# Patient Record
Sex: Female | Born: 1955 | Race: Black or African American | Hispanic: No | Marital: Married | Smoking: Never smoker
Health system: Southern US, Community
[De-identification: ages and names within clinical notes are randomized; demographics above are authoritative.]

## PROBLEM LIST (undated history)

## (undated) DIAGNOSIS — G2581 Restless legs syndrome: Secondary | ICD-10-CM

## (undated) DIAGNOSIS — K5790 Diverticulosis of intestine, part unspecified, without perforation or abscess without bleeding: Secondary | ICD-10-CM

## (undated) DIAGNOSIS — T7840XA Allergy, unspecified, initial encounter: Secondary | ICD-10-CM

## (undated) DIAGNOSIS — J329 Chronic sinusitis, unspecified: Secondary | ICD-10-CM

## (undated) HISTORY — DX: Allergy, unspecified, initial encounter: T78.40XA

## (undated) HISTORY — PX: COLONOSCOPY: SHX174

## (undated) HISTORY — DX: Diverticulosis of intestine, part unspecified, without perforation or abscess without bleeding: K57.90

## (undated) HISTORY — DX: Chronic sinusitis, unspecified: J32.9

## (undated) HISTORY — PX: NASAL POLYP SURGERY: SHX186

## (undated) HISTORY — DX: Restless legs syndrome: G25.81

---

## 2000-05-28 ENCOUNTER — Encounter: Payer: Self-pay | Admitting: Family Medicine

## 2000-05-28 ENCOUNTER — Encounter: Admission: RE | Admit: 2000-05-28 | Discharge: 2000-05-28 | Payer: Self-pay | Admitting: Family Medicine

## 2005-09-27 ENCOUNTER — Other Ambulatory Visit: Admission: RE | Admit: 2005-09-27 | Discharge: 2005-09-27 | Payer: Self-pay | Admitting: Family Medicine

## 2005-11-29 ENCOUNTER — Encounter: Admission: RE | Admit: 2005-11-29 | Discharge: 2005-11-29 | Payer: Self-pay | Admitting: Family Medicine

## 2006-02-20 ENCOUNTER — Encounter: Admission: RE | Admit: 2006-02-20 | Discharge: 2006-02-20 | Payer: Self-pay | Admitting: Family Medicine

## 2006-07-30 ENCOUNTER — Ambulatory Visit: Payer: Self-pay | Admitting: Family Medicine

## 2006-09-30 ENCOUNTER — Ambulatory Visit: Payer: Self-pay | Admitting: Family Medicine

## 2007-01-14 ENCOUNTER — Ambulatory Visit: Payer: Self-pay | Admitting: Family Medicine

## 2007-02-12 ENCOUNTER — Ambulatory Visit: Payer: Self-pay | Admitting: Family Medicine

## 2007-08-04 ENCOUNTER — Ambulatory Visit: Payer: Self-pay | Admitting: Family Medicine

## 2007-09-12 ENCOUNTER — Ambulatory Visit: Payer: Self-pay | Admitting: Family Medicine

## 2007-09-30 ENCOUNTER — Ambulatory Visit: Payer: Self-pay | Admitting: Family Medicine

## 2007-09-30 ENCOUNTER — Encounter: Admission: RE | Admit: 2007-09-30 | Discharge: 2007-09-30 | Payer: Self-pay | Admitting: Family Medicine

## 2007-11-17 ENCOUNTER — Ambulatory Visit: Payer: Self-pay | Admitting: Family Medicine

## 2007-12-19 ENCOUNTER — Ambulatory Visit: Payer: Self-pay | Admitting: Family Medicine

## 2007-12-31 ENCOUNTER — Ambulatory Visit: Payer: Self-pay | Admitting: Family Medicine

## 2008-06-25 ENCOUNTER — Encounter: Admission: RE | Admit: 2008-06-25 | Discharge: 2008-06-25 | Payer: Self-pay | Admitting: Family Medicine

## 2009-02-07 ENCOUNTER — Ambulatory Visit: Payer: Self-pay | Admitting: Family Medicine

## 2009-02-07 ENCOUNTER — Encounter: Admission: RE | Admit: 2009-02-07 | Discharge: 2009-02-07 | Payer: Self-pay | Admitting: Family Medicine

## 2009-05-17 ENCOUNTER — Ambulatory Visit: Payer: Self-pay | Admitting: Family Medicine

## 2009-07-14 ENCOUNTER — Encounter: Admission: RE | Admit: 2009-07-14 | Discharge: 2009-07-14 | Payer: Self-pay | Admitting: Family Medicine

## 2009-07-14 LAB — HM MAMMOGRAPHY

## 2009-07-27 ENCOUNTER — Ambulatory Visit: Payer: Self-pay | Admitting: Family Medicine

## 2009-09-15 ENCOUNTER — Ambulatory Visit: Payer: Self-pay | Admitting: Family Medicine

## 2009-10-19 ENCOUNTER — Encounter: Admission: RE | Admit: 2009-10-19 | Discharge: 2009-10-19 | Payer: Self-pay | Admitting: Family Medicine

## 2009-10-19 ENCOUNTER — Ambulatory Visit: Payer: Self-pay | Admitting: Family Medicine

## 2009-12-21 ENCOUNTER — Ambulatory Visit: Payer: Self-pay | Admitting: Family Medicine

## 2010-01-05 ENCOUNTER — Encounter: Admission: RE | Admit: 2010-01-05 | Discharge: 2010-01-05 | Payer: Self-pay | Admitting: Otolaryngology

## 2010-03-30 ENCOUNTER — Ambulatory Visit: Payer: Self-pay | Admitting: Physician Assistant

## 2010-06-05 ENCOUNTER — Ambulatory Visit: Payer: Self-pay | Admitting: Family Medicine

## 2010-09-04 ENCOUNTER — Ambulatory Visit: Payer: Self-pay | Admitting: Family Medicine

## 2010-11-24 ENCOUNTER — Ambulatory Visit
Admission: RE | Admit: 2010-11-24 | Discharge: 2010-11-24 | Payer: Self-pay | Source: Home / Self Care | Attending: Family Medicine | Admitting: Family Medicine

## 2010-11-29 ENCOUNTER — Ambulatory Visit
Admission: RE | Admit: 2010-11-29 | Discharge: 2010-11-29 | Payer: Self-pay | Source: Home / Self Care | Attending: Family Medicine | Admitting: Family Medicine

## 2010-12-10 ENCOUNTER — Encounter: Payer: Self-pay | Admitting: Family Medicine

## 2011-01-24 ENCOUNTER — Ambulatory Visit (INDEPENDENT_AMBULATORY_CARE_PROVIDER_SITE_OTHER): Payer: BC Managed Care – PPO | Admitting: Family Medicine

## 2011-01-24 DIAGNOSIS — J45901 Unspecified asthma with (acute) exacerbation: Secondary | ICD-10-CM

## 2011-01-24 DIAGNOSIS — J0100 Acute maxillary sinusitis, unspecified: Secondary | ICD-10-CM

## 2011-04-19 ENCOUNTER — Other Ambulatory Visit: Payer: Self-pay | Admitting: Family Medicine

## 2011-04-19 MED ORDER — MELOXICAM 15 MG PO TABS: 15.0000 mg | ORAL_TABLET | Freq: Every day | ORAL | Status: DC

## 2011-05-05 ENCOUNTER — Other Ambulatory Visit: Payer: Self-pay | Admitting: Family Medicine

## 2011-05-10 ENCOUNTER — Encounter: Payer: Self-pay | Admitting: Family Medicine

## 2011-05-10 ENCOUNTER — Ambulatory Visit (INDEPENDENT_AMBULATORY_CARE_PROVIDER_SITE_OTHER): Payer: BC Managed Care – PPO | Admitting: Family Medicine

## 2011-05-10 VITALS — BP 110/76 | HR 76 | Temp 98.2°F | Ht 63.0 in | Wt 164.0 lb

## 2011-05-10 DIAGNOSIS — J209 Acute bronchitis, unspecified: Secondary | ICD-10-CM

## 2011-05-10 DIAGNOSIS — J45901 Unspecified asthma with (acute) exacerbation: Secondary | ICD-10-CM | POA: Insufficient documentation

## 2011-05-10 MED ORDER — BUDESONIDE-FORMOTEROL FUMARATE 160-4.5 MCG/ACT IN AERO: 2.0000 | INHALATION_SPRAY | Freq: Two times a day (BID) | RESPIRATORY_TRACT | Status: DC

## 2011-05-10 MED ORDER — ALBUTEROL SULFATE HFA 108 (90 BASE) MCG/ACT IN AERS: 2.0000 | INHALATION_SPRAY | Freq: Four times a day (QID) | RESPIRATORY_TRACT | Status: DC | PRN

## 2011-05-10 MED ORDER — AZITHROMYCIN 250 MG PO TABS: ORAL_TABLET | ORAL | Status: AC

## 2011-05-10 MED ORDER — PREDNISONE 20 MG PO TABS: 20.0000 mg | ORAL_TABLET | Freq: Two times a day (BID) | ORAL | Status: AC

## 2011-05-10 NOTE — Patient Instructions (Signed)
Increase Symbicort to 2 puffs twice daily.  Once your asthma is completely stable, you can consider decreasing back to prior dose, but always to increase back up to 2 puffs twice daily with any exacerbation.  Make sure you use your albuterol as needed, using your peak flows to help you know when you need it.

## 2011-05-10 NOTE — Progress Notes (Signed)
  Subjective:    Patient ID: Kayla Henry, female    DOB: 18-May-1956, 55 y.o.   MRN: [id number]  HPI Patient presents with complaint of 2 days of wheezing and tightness in her chest.  She is coughing up white, yellow and brown phlegm. Having some head congestion, but denies sinus pain.  Doing sinus rinses,and only getting small amount of yellow mucus.  Denies fevers.  Denies sick contacts.  Using Symbicort 1P twice daily, and Flonase 1 spray twice daily.  Needing to use ProAir HFA over the last few days, no more than 2-3 times/day.  Gets a good response to the inhaler.  Baseline PF's are in the 500's.  Peak Flows have been 350's the last couple of days  Current Outpatient Prescriptions on File Prior to Visit  Medication Sig Dispense Refill  . budesonide-formoterol (SYMBICORT) 160-4.5 MCG/ACT inhaler Inhale 2 puffs into the lungs 2 (two) times daily.  1 Inhaler  5  . fluticasone (FLONASE) 50 MCG/ACT nasal spray USE TWO SPRAY IN EACH NOSTRIL TWICE DAILY;MUST BE SEEN  16 g  0  . meloxicam (MOBIC) 15 MG tablet Take 1 tablet (15 mg total) by mouth daily.  30 tablet  2   Allergies  Allergen Reactions  . Sulfa Antibiotics Hives   Past Medical History  Diagnosis Date  . Asthma   . Allergy   . Diverticulosis    Immunization History  Administered Date(s) Administered  . Pneumococcal Polysaccharide 09/27/2005   Review of Systems Denies fevers, nausea, vomiting, diarrhea, skin rash, or other complaints    Objective:   Physical Exam BP 110/76  Pulse 76  Temp(Src) 98.2 F (36.8 C) (Oral)  Ht 5\' 3"  (1.6 m)  Wt 164 lb (74.39 kg)  BMI 29.05 kg/m2 Well developed female, in no distress.  Speaking easily in full sentences, only occasional cough Audible upper airway wheezing heard from across the room, improved after coughing Focal area of ronchi R base.  After coughing, increased, and some wheezes were noted throughout, but slightly more prominent in R base.  Fair air movement  throughout. HEENT:  PERRL, EOMI, conjunctiva clear.  OP clear, Mucus membranes moist, sinuses nontender, nose, mild edema, no purulence or erythema Neck: no lymphadenopathy Heart:  Regular rate and rhythm  Peak flow 250 x 2     Assessment & Plan:   1. Bronchitis, acute  azithromycin (ZITHROMAX Z-PAK) 250 MG tablet  2. Asthma exacerbation  albuterol (PROAIR HFA) 108 (90 BASE) MCG/ACT inhaler, budesonide-formoterol (SYMBICORT) 160-4.5 MCG/ACT inhaler, predniSONE (DELTASONE) 20 MG tablet    Patient declines nebulizer treatment today.  States she can give herself one at home, and she needs to leave. Instructed to increase Symbicort to 2P BID, and to use albuterol more frequently if needed, (especially as she notes her PF's to be low).  Continue monitoring PF. Prednisone 20mg  BID x 3 days--risks and side effects were reviewed

## 2011-09-11 ENCOUNTER — Encounter: Payer: Self-pay | Admitting: Family Medicine

## 2011-09-11 ENCOUNTER — Ambulatory Visit (INDEPENDENT_AMBULATORY_CARE_PROVIDER_SITE_OTHER): Payer: BC Managed Care – PPO | Admitting: Family Medicine

## 2011-09-11 ENCOUNTER — Other Ambulatory Visit: Payer: Self-pay | Admitting: Family Medicine

## 2011-09-11 DIAGNOSIS — G2581 Restless legs syndrome: Secondary | ICD-10-CM

## 2011-09-11 DIAGNOSIS — J45901 Unspecified asthma with (acute) exacerbation: Secondary | ICD-10-CM

## 2011-09-11 DIAGNOSIS — J45909 Unspecified asthma, uncomplicated: Secondary | ICD-10-CM

## 2011-09-11 DIAGNOSIS — J019 Acute sinusitis, unspecified: Secondary | ICD-10-CM

## 2011-09-11 MED ORDER — LEVOFLOXACIN 500 MG PO TABS: 500.0000 mg | ORAL_TABLET | Freq: Every day | ORAL | Status: AC

## 2011-09-11 MED ORDER — ROPINIROLE HCL 0.25 MG PO TABS: 0.2500 mg | ORAL_TABLET | Freq: Three times a day (TID) | ORAL | Status: DC

## 2011-09-11 NOTE — Patient Instructions (Addendum)
Usually your inhaler 2 puffs twice a day. Call me at the end of the course of the antibiotic and let he know how you are doing. Keep using the albuterol. Come back in about a month we'll check your pressure again

## 2011-09-11 NOTE — Progress Notes (Signed)
  Subjective:    Patient ID: Kayla Henry, female    DOB: 18-Sep-1956, 55 y.o.   MRN: [id number]  HPI She has a two-week history of sore throat, sinus pressure, PND, malaise and now productive cough. She has a long history of difficulty with this every several months. This has been going on for the last several years. She has apparently had a workup including CT of sinuses and ENT evaluation in the past for this which was essentially negative. She continues on her Symbicort but has only been taking it daily until recently when she increases to twice a day. She continues with nasal irrigation and use of Flonase. She has used her Provera twice in the last week. At the end of the interview after we discussed everything she then mentioned difficulty with sleep and restless legs. She has no difficulty with these during the day when she lays down at night it interferes with her ability to follow sleep.   Review of Systems     Objective:   Physical Exam alert and in no distress. Tympanic membranes and canals are normal. Throat is clear. Tonsils are normal. Neck is supple without adenopathy or thyromegaly. Cardiac exam shows a regular sinus rhythm without murmurs or gallops. Lungs showed bilateral rhonchi and expiratory wheezing. Nasal mucosa is slightly reddish. No tenderness over sinuses.        Assessment & Plan:   1. Asthma exacerbation   2. Sinusitis, acute   3. Asthmatic bronchitis   4. RLS (restless legs syndrome)    she is to continue to take her Symbicort 2 puffs twice per day. I will place her on Levaquin and she is to call me at the end of the course of this. She will continue on her nasal inhaler. I will also give her Requip and let me know how this medication works.

## 2011-09-11 NOTE — Telephone Encounter (Signed)
Patient would like a refill on Mobic. CLS

## 2011-09-12 ENCOUNTER — Telehealth: Payer: Self-pay | Admitting: *Deleted

## 2011-09-12 NOTE — Telephone Encounter (Signed)
Refill request came in for patient's Mobic, refused by Dr.Knapp as she did not know what patient took this medication for. Patient needs to schedule appt in the near future for either med check or CPE and Dr.Knapp will discuss at that visit the need for Mobic. Left message on patient's voicemail to return my call.

## 2011-10-01 ENCOUNTER — Telehealth: Payer: Self-pay | Admitting: Family Medicine

## 2011-10-01 MED ORDER — LEVOFLOXACIN 500 MG PO TABS: 500.0000 mg | ORAL_TABLET | Freq: Every day | ORAL | Status: AC

## 2011-10-01 MED ORDER — MELOXICAM 15 MG PO TABS: 15.0000 mg | ORAL_TABLET | Freq: Every day | ORAL | Status: DC

## 2011-10-01 MED ORDER — FLUTICASONE PROPIONATE 50 MCG/ACT NA SUSP: 2.0000 | Freq: Every day | NASAL | Status: DC

## 2011-10-01 NOTE — Telephone Encounter (Signed)
I called her medication in. Call her and let her know

## 2011-10-01 NOTE — Telephone Encounter (Signed)
Pt states she also asked about her Mobic refill when she called this morning. She states she takes for arthritis and original rx was given by our office  [facility] on [address]

## 2011-10-01 NOTE — Telephone Encounter (Signed)
Pt notified it was called in

## 2011-10-01 NOTE — Telephone Encounter (Signed)
E-rxd

## 2011-10-01 NOTE — Telephone Encounter (Signed)
Flonase and Levaquin was called in.

## 2011-10-18 ENCOUNTER — Encounter: Payer: Self-pay | Admitting: Family Medicine

## 2011-12-03 ENCOUNTER — Ambulatory Visit (INDEPENDENT_AMBULATORY_CARE_PROVIDER_SITE_OTHER): Payer: BC Managed Care – PPO | Admitting: Family Medicine

## 2011-12-03 ENCOUNTER — Encounter: Payer: Self-pay | Admitting: Family Medicine

## 2011-12-03 VITALS — BP 130/74 | HR 76 | Temp 98.2°F | Ht 63.0 in | Wt 163.0 lb

## 2011-12-03 DIAGNOSIS — J069 Acute upper respiratory infection, unspecified: Secondary | ICD-10-CM

## 2011-12-03 DIAGNOSIS — Z23 Encounter for immunization: Secondary | ICD-10-CM

## 2011-12-03 MED ORDER — CLARITHROMYCIN ER 500 MG PO TB24: 1000.0000 mg | ORAL_TABLET | Freq: Every day | ORAL | Status: AC

## 2011-12-03 NOTE — Progress Notes (Signed)
Chief complaint: cough and congestion with yellow mucus x 3-4 days.  HPI: Patient began with cough and nasal congestion 3-4 days ago, then started coughing up phlegm.  Phlegm is yellowish.  Not getting much nasal discharge, but occasional yellow. + sinus pain, pressure, headaches. Denies sore throat, fever.  Took an OTC decongestant, which helped her sleep.  Also taking alka selzer decongestant.  Not using any meds during day.  Cough doesn't keep her awake at night.  Denies shortness of breath.  Doing sinus rinses, not getting much drainage out.  Past Medical History  Diagnosis Date  . Asthma   . Allergy   . Diverticulosis     Past Surgical History  Procedure Date  . Nasal polyp surgery     History   Social History  . Marital Status: Married    Spouse Name: N/A    Number of Children: N/A  . Years of Education: N/A   Occupational History  . Principal at [employer]    Social History Main Topics  . Smoking status: Never Smoker   . Smokeless tobacco: Never Used  . Alcohol Use: Yes     1 glass of wine twice a year.  . Drug Use: No  . Sexually Active: Not on file   Other Topics Concern  . Not on file   Social History Narrative  . No narrative on file   Current Outpatient Prescriptions on File Prior to Visit  Medication Sig Dispense Refill  . albuterol (PROAIR HFA) 108 (90 BASE) MCG/ACT inhaler Inhale 2 puffs into the lungs every 6 (six) hours as needed.  18 g  2  . budesonide-formoterol (SYMBICORT) 160-4.5 MCG/ACT inhaler Inhale 2 puffs into the lungs 2 (two) times daily.  1 Inhaler  5  . fluticasone (FLONASE) 50 MCG/ACT nasal spray Place 2 sprays into the nose daily.  16 g  11  . meloxicam (MOBIC) 15 MG tablet Take 1 tablet (15 mg total) by mouth daily.  30 tablet  2  . Multiple Vitamins-Minerals (MULTIVITAMIN WITH MINERALS) tablet Take 1 tablet by mouth daily.        Marland Kitchen rOPINIRole (REQUIP) 0.25 MG tablet Take 1 tablet (0.25 mg total) by mouth 3 (three) times daily.   30 tablet  2    Allergies  Allergen Reactions  . Sulfa Antibiotics Hives   ROS: denies fever, nausea, vomiting, diarrhea, skin rash, chest pain  PHYSICAL EXAM: BP 130/74  Pulse 76  Temp(Src) 98.2 F (36.8 C) (Oral)  Ht 5\' 3"  (1.6 m)  Wt 163 lb (73.936 kg)  BMI 28.87 kg/m2 Pleasant female, mildly congested-sounding, not coughing, in no distress HEENT:  PERRL, EOMI, conjunctiva clear. TM's and EAC's normal.  Nasal mucosa mildly edematous, slightly red, no purulence.  Tender over maxillary sinuses bilaterally.  OP clear without erythema Neck: no lymphadenopathy Heart: regular rate and rhythm without murmur Lungs: clear bilaterally posteriorly. Anterior on R there were some ronchi and trace wheeze which improved after coughing (expectorated small amount of phlegm).  Good air movement throughout Skin: no rash  ASSESSMENT/PLAN: 1. URI (upper respiratory infection)  clarithromycin (BIAXIN XL) 500 MG 24 hr tablet  2. Need for prophylactic vaccination and inoculation against influenza  Flu vaccine greater than or equal to 3yo preservative free IM   URI, likely viral, in a patient with allergies and asthma with frequent bacterial complication of URI's.  At this point has no evidence of bacterial infection.  I recommend using expectorant decongestant regularly, continue sinus rinses,  and if no improvement in 1-2 days, start ABX.

## 2011-12-03 NOTE — Patient Instructions (Addendum)
I recommend using expectorant (such as Mucinex), as well as decongestant (ie Mucinex-D or sudafed) to help with sinus pain and pressure. If symptoms persist or worsen over the next few days, start the Biaxin.  You may need to use your albuterol if having increasing chest congestion or shortness of breath.  Follow up if high fevers, worsening shortness of breath, or ongoing symptoms, despite use of antibiotics and albuterol (in addition to your chronic medications).  Try and get flu shots in the fall (October)

## 2011-12-28 ENCOUNTER — Ambulatory Visit (INDEPENDENT_AMBULATORY_CARE_PROVIDER_SITE_OTHER): Payer: BC Managed Care – PPO | Admitting: Family Medicine

## 2011-12-28 ENCOUNTER — Encounter: Payer: Self-pay | Admitting: Family Medicine

## 2011-12-28 ENCOUNTER — Telehealth: Payer: Self-pay | Admitting: Internal Medicine

## 2011-12-28 VITALS — BP 150/90 | HR 90 | Ht 63.0 in | Wt 157.0 lb

## 2011-12-28 DIAGNOSIS — J45909 Unspecified asthma, uncomplicated: Secondary | ICD-10-CM

## 2011-12-28 MED ORDER — METHYLPREDNISOLONE SODIUM SUCC 125 MG IJ SOLR: 125.0000 mg | Freq: Once | INTRAMUSCULAR | Status: DC

## 2011-12-28 MED ORDER — METHYLPREDNISOLONE SODIUM SUCC 125 MG IJ SOLR
125.0000 mg | Freq: Once | INTRAMUSCULAR | Status: AC
  Administered 2011-12-28: 125 mg via INTRAMUSCULAR

## 2011-12-28 MED ORDER — PREDNISONE 20 MG PO TABS: 20.0000 mg | ORAL_TABLET | Freq: Every day | ORAL | Status: DC

## 2011-12-28 MED ORDER — PREDNISONE 20 MG PO TABS: ORAL_TABLET | ORAL | Status: DC

## 2011-12-28 MED ORDER — CLARITHROMYCIN 500 MG PO TABS: 500.0000 mg | ORAL_TABLET | Freq: Two times a day (BID) | ORAL | Status: AC

## 2011-12-28 NOTE — Patient Instructions (Signed)
Start back on your Symbicort and continue to use albuterol as a rescue. Start taking the Biaxin and the prednisone.

## 2011-12-28 NOTE — Progress Notes (Signed)
  Subjective:    Patient ID: Ernesta Amble, female    DOB: Aug 31, 1956, 56 y.o.   MRN: [id number]  HPI Proximally 3 weeks ago she developed symptoms of sinus congestion, rhinorrhea, headache. She has an underlying history of asthma and is on Symbicort as well as a rescue inhaler. She was seen January 14. She was given an antibiotic to be taken if her symptoms continued however she did not take it. Her sinus symptoms improved. She continued on her asthma medication. Approximately 5 days ago she noted the onset of increasing shortness of breath, wheezing, coughing. No fever or chills.   Review of Systems     Objective:   Physical Exam alert and in no distress. Tympanic membranes and canals are normal. Throat is clear. Tonsils are normal. Neck is supple without adenopathy or thyromegaly. Cardiac exam shows a regular sinus rhythm without murmurs or gallops. Lungs initially showed inspiratory and expiratory wheezing. She did respond to 3 nebulizer treatments. Peak flow initially was 170 and increased to 220. She did have 4 nebulizer treatments.        Assessment & Plan:   1. Asthmatic bronchitis    she is to take the Biaxin as well as prednisone. She'll also start back on Symbicort and using a as rescue. Recheck here in 10 days

## 2011-12-28 NOTE — Telephone Encounter (Signed)
Pt is suppose to take 2 pills in the morning per Dr. Susann Givens.

## 2012-02-06 ENCOUNTER — Other Ambulatory Visit: Payer: Self-pay | Admitting: Family Medicine

## 2012-02-06 NOTE — Telephone Encounter (Signed)
Mobic renewed.

## 2012-02-06 NOTE — Telephone Encounter (Signed)
Is this ok?

## 2012-02-06 NOTE — Telephone Encounter (Signed)
Is this ok to refill?  

## 2012-03-28 ENCOUNTER — Other Ambulatory Visit: Payer: Self-pay

## 2012-03-28 ENCOUNTER — Ambulatory Visit (INDEPENDENT_AMBULATORY_CARE_PROVIDER_SITE_OTHER): Payer: BC Managed Care – PPO | Admitting: Family Medicine

## 2012-03-28 ENCOUNTER — Ambulatory Visit
Admission: RE | Admit: 2012-03-28 | Discharge: 2012-03-28 | Disposition: A | Discharge status: Home / Self Care | Payer: BC Managed Care – PPO | Source: Ambulatory Visit | Attending: Family Medicine | Admitting: Family Medicine

## 2012-03-28 ENCOUNTER — Encounter: Payer: Self-pay | Admitting: Family Medicine

## 2012-03-28 VITALS — BP 130/90 | HR 75 | Resp 18 | Wt 152.0 lb

## 2012-03-28 DIAGNOSIS — J45909 Unspecified asthma, uncomplicated: Secondary | ICD-10-CM

## 2012-03-28 DIAGNOSIS — R062 Wheezing: Secondary | ICD-10-CM

## 2012-03-28 MED ORDER — METHYLPREDNISOLONE SODIUM SUCC 125 MG IJ SOLR
125.0000 mg | Freq: Once | INTRAMUSCULAR | Status: AC
  Administered 2012-03-28: 125 mg via INTRAMUSCULAR

## 2012-03-28 MED ORDER — PREDNISONE 10 MG PO KIT: PACK | ORAL | Status: DC

## 2012-03-28 MED ORDER — CLARITHROMYCIN 500 MG PO TABS: 500.0000 mg | ORAL_TABLET | Freq: Two times a day (BID) | ORAL | Status: AC

## 2012-03-28 NOTE — Progress Notes (Signed)
  Subjective:    Patient ID: Kayla Henry, female    DOB: 05/07/1956, 56 y.o.   MRN: [id number]  HPI 2 weeks ago she started having difficulty with sinus congestion followed by cough and chest tightness. No sore throat, fever, chills, earache. She does not smoke. She had a similar episode in February requiring needing treatments, steroids and an antibiotic   Review of Systems     Objective:   Physical Exam alert and in no distress. Tympanic membranes and canals are normal. Throat is clear. Tonsils are normal. Neck is supple without adenopathy or thyromegaly. Cardiac exam shows a regular sinus rhythm without murmurs or gallops. Lungs show wheezing. She did improve with several nebulizer treatments.        Assessment & Plan:   1. Wheezing  PR INHAL RX, AIRWAY OBST/DX SPUTUM INDUCT, PR ALBUTEROL IPRATROP NON-COMP, PR INHAL RX, AIRWAY OBST/DX SPUTUM INDUCT, PR ALBUTEROL IPRATROP NON-COMP, DG Chest 2 View  2. Asthmatic bronchitis  methylPREDNISolone sodium succinate (SOLU-MEDROL) 125 mg/2 mL injection 125 mg, PredniSONE (STERAPRED DS 12 DAY) 10 MG KIT, clarithromycin (BIAXIN) 500 MG tablet, DG Chest 2 View   Chest x-ray was also ordered which was negative for acute problems. If she continues to have difficulty, further evaluation will be necessary. I discussed the continued intermittent use of steroids and she is in agreement that she does not want to continue to do this.

## 2012-03-28 NOTE — Patient Instructions (Signed)
Call me if any questions. If this recurs, we will need to pursue this further

## 2012-03-28 NOTE — Telephone Encounter (Signed)
Resent med for 48 pills per [employer]

## 2012-03-31 NOTE — Progress Notes (Signed)
Addended by: Lavell Islam on: 03/31/2012 12:36 PM   Modules accepted: Orders

## 2012-05-09 ENCOUNTER — Other Ambulatory Visit: Payer: Self-pay | Admitting: Family Medicine

## 2012-05-12 NOTE — Telephone Encounter (Signed)
Is this ok?

## 2012-07-10 ENCOUNTER — Other Ambulatory Visit: Payer: Self-pay | Admitting: Family Medicine

## 2012-08-05 ENCOUNTER — Encounter: Payer: Self-pay | Admitting: Family Medicine

## 2012-08-05 ENCOUNTER — Ambulatory Visit (INDEPENDENT_AMBULATORY_CARE_PROVIDER_SITE_OTHER): Payer: BC Managed Care – PPO | Admitting: Family Medicine

## 2012-08-05 VITALS — BP 120/80 | HR 71 | Wt 154.0 lb

## 2012-08-05 DIAGNOSIS — J45901 Unspecified asthma with (acute) exacerbation: Secondary | ICD-10-CM

## 2012-08-05 DIAGNOSIS — J45909 Unspecified asthma, uncomplicated: Secondary | ICD-10-CM

## 2012-08-05 MED ORDER — MOXIFLOXACIN HCL 400 MG PO TABS: 400.0000 mg | ORAL_TABLET | Freq: Every day | ORAL | Status: DC

## 2012-08-05 MED ORDER — PREDNISONE 10 MG PO KIT: PACK | ORAL | Status: DC

## 2012-08-05 NOTE — Progress Notes (Signed)
  Subjective:    Patient ID: Kayla Henry, female    DOB: 04-Dec-1955, 56 y.o.   MRN: [id number]  HPI She started 2 weeks ago with a slight cough followed by ENT, worsening cough. No sore throat, fever, earache. She is getting more and more short of breath. She continues on her asthma medications as well as Flonase . She has been using her rescue inhaler quite frequently per She does get spring and fall allergies but they have not given her much trouble recently. She does not smoke.   Review of Systems     Objective:   Physical Exam alert and in no distress. Tympanic membranes and canals are normal. Throat is clear. Tonsils are normal. Neck is supple without adenopathy or thyromegaly. Cardiac exam shows a regular sinus rhythm without murmurs or gallops. Lungs expiratory wheezing        Assessment & Plan:   1. Acute asthmatic bronchitis  moxifloxacin (AVELOX) 400 MG tablet, PredniSONE (STERAPRED DS 12 DAY) 10 MG KIT   discuss therapies with her. She indicated that Avelox seems to be the best medication for her. We also discussed various options and decided to use Sterapred dose pack. She will call if further difficulties.

## 2012-09-25 ENCOUNTER — Encounter: Payer: Self-pay | Admitting: Family Medicine

## 2012-09-25 ENCOUNTER — Ambulatory Visit (INDEPENDENT_AMBULATORY_CARE_PROVIDER_SITE_OTHER): Payer: BC Managed Care – PPO | Admitting: Family Medicine

## 2012-09-25 ENCOUNTER — Other Ambulatory Visit: Payer: Self-pay

## 2012-09-25 VITALS — BP 136/84 | Wt 161.0 lb

## 2012-09-25 DIAGNOSIS — B029 Zoster without complications: Secondary | ICD-10-CM

## 2012-09-25 MED ORDER — HYDROCODONE-ACETAMINOPHEN 5-500 MG PO TABS: 1.0000 | ORAL_TABLET | ORAL | Status: DC | PRN

## 2012-09-25 MED ORDER — VALACYCLOVIR HCL 500 MG PO TABS: 1000.0000 mg | ORAL_TABLET | Freq: Three times a day (TID) | ORAL | Status: DC

## 2012-09-25 NOTE — Telephone Encounter (Signed)
called in vicodine

## 2012-09-25 NOTE — Progress Notes (Signed)
  Subjective:    Patient ID: Kayla Henry, female    DOB: [birth year], 56 y.o.   MRN: [id number]  HPI She is here for evaluation of pain and a rash as well as left breast pain for 2 days.   Review of Systems     Objective:   Physical Exam Classic vesicles with an erythematous base are noted on the back on the left and on the left breast.       Assessment & Plan:   1. Shingles  valACYclovir (VALTREX) 500 MG tablet, HYDROcodone-acetaminophen (VICODIN) 5-500 MG per tablet   information concerning shingles was given. She is to call if she has further difficulty.

## 2012-09-25 NOTE — Patient Instructions (Addendum)
Shingles Shingles is caused by the same virus that causes chickenpox (varicella zoster virus or VZV). Shingles often occurs many years or decades after having chickenpox. That is why it is more common in adults older than 50 years. The virus reactivates and breaks out as an infection in a nerve root. SYMPTOMS   The initial feeling (sensations) may be pain. This pain is usually described as:  Burning.  Stabbing.  Throbbing.  Tingling in the nerve root.  A red rash will follow in a couple days. The rash may occur in any area of the body and is usually on one side (unilateral) of the body in a band or belt-like pattern. The rash usually starts out as very small blisters (vesicles). They will dry up after 7 to 10 days. This is not usually a significant problem except for the pain it causes.  Long-lasting (chronic) pain is more likely in an elderly person. It can last months to years. This condition is called postherpetic neuralgia. Shingles can be an extremely severe infection in someone with AIDS, a weakened immune system, or with forms of leukemia. It can also be severe if you are taking transplant medicines or other medicines that weaken the immune system. TREATMENT  Your caregiver will often treat you with:  Antiviral drugs.  Anti-inflammatory drugs.  Pain medicines. Bed rest is very important in preventing the pain associated with herpes zoster (postherpetic neuralgia). Application of heat in the form of a hot water bottle or electric heating pad or gentle pressure with the hand is recommended to help with the pain or discomfort. PREVENTION  A varicella zoster vaccine is available to help protect against the virus. The Food and Drug Administration approved the varicella zoster vaccine for individuals 105 years of age and older. HOME CARE INSTRUCTIONS   Cool compresses to the area of rash may be helpful.  Only take over-the-counter or prescription medicines for pain, discomfort, or  fever as directed by your caregiver.  Avoid contact with:  Babies.  Pregnant women.  Children with eczema.  Elderly people with transplants.  People with chronic illnesses, such as leukemia and AIDS.  If the area involved is on your face, you may receive a referral for follow-up to a specialist. It is very important to keep all follow-up appointments. This will help avoid eye complications, chronic pain, or disability. SEEK IMMEDIATE MEDICAL CARE IF:   You develop any pain (headache) in the area of the face or eye. This must be followed carefully by your caregiver or ophthalmologist. An infection in part of your eye (cornea) can be very serious. It could lead to blindness.  You do not have pain relief from prescribed medicines.  Your redness or swelling spreads.  The area involved becomes very swollen and painful.  You have a fever.  You notice any red or painful lines extending away from the affected area toward your heart (lymphangitis).  Your condition is worsening or has changed. Document Released: 11/05/2005 Document Revised: 01/28/2012 Document Reviewed: 10/10/2009 Briannon Boggio C[facility] Patient Information 2013 [place], [place]. I will give you Vicodin for the pain and you can also take Aleve 2 tablets 2 or 3 times per day

## 2012-10-02 ENCOUNTER — Other Ambulatory Visit: Payer: Self-pay

## 2012-10-02 ENCOUNTER — Telehealth: Payer: Self-pay | Admitting: Family Medicine

## 2012-10-02 MED ORDER — TRAMADOL HCL 50 MG PO TABS: ORAL_TABLET | ORAL | Status: DC

## 2012-10-02 NOTE — Telephone Encounter (Signed)
She's having difficulty with codeine causing nausea and vomiting. I will switch her to tramadol

## 2012-10-02 NOTE — Telephone Encounter (Signed)
Left message for pt tramadol called in

## 2012-10-02 NOTE — Telephone Encounter (Signed)
Let her know that we'll switch her to a different pain medication and called it in.

## 2012-10-13 ENCOUNTER — Telehealth: Payer: Self-pay | Admitting: Family Medicine

## 2012-10-13 NOTE — Telephone Encounter (Signed)
Pt has appt

## 2012-10-13 NOTE — Telephone Encounter (Signed)
Unlikely that is truly shingles. Tell her that I would like to see lesions

## 2012-10-15 ENCOUNTER — Ambulatory Visit (INDEPENDENT_AMBULATORY_CARE_PROVIDER_SITE_OTHER): Payer: BC Managed Care – PPO | Admitting: Family Medicine

## 2012-10-15 DIAGNOSIS — L309 Dermatitis, unspecified: Secondary | ICD-10-CM

## 2012-10-15 DIAGNOSIS — L259 Unspecified contact dermatitis, unspecified cause: Secondary | ICD-10-CM

## 2012-10-15 NOTE — Progress Notes (Signed)
  Subjective:    Patient ID: Kayla Henry, female    DOB: Mar 19, 1956, 56 y.o.   MRN: [id number]  HPI She is here for evaluation of lesions present on her hands. 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 She is getting better with the shingles now having some pain in that area. These lesions reoccurred and are mainly on her right hand.   Review of Systems     Objective:   Physical Exam Vesicular non-erythematous lesions noted in the web space on the right foot between fourth and fifth fingers. Microscopic evaluation was negative.       Assessment & Plan:   1. Dermatitis    recommend conservative care however if this continues, she is to call me for referral to dermatology.

## 2012-11-09 ENCOUNTER — Other Ambulatory Visit: Payer: Self-pay | Admitting: Family Medicine

## 2012-12-01 ENCOUNTER — Encounter: Payer: Self-pay | Admitting: Internal Medicine

## 2012-12-01 ENCOUNTER — Ambulatory Visit (INDEPENDENT_AMBULATORY_CARE_PROVIDER_SITE_OTHER): Payer: BC Managed Care – PPO | Admitting: Medical

## 2012-12-01 ENCOUNTER — Encounter: Payer: Self-pay | Admitting: Medical

## 2012-12-01 VITALS — BP 130/80 | HR 92 | Temp 98.2°F | Resp 16 | Wt 157.0 lb

## 2012-12-01 DIAGNOSIS — M79609 Pain in unspecified limb: Secondary | ICD-10-CM

## 2012-12-01 DIAGNOSIS — Z23 Encounter for immunization: Secondary | ICD-10-CM

## 2012-12-01 DIAGNOSIS — M79606 Pain in leg, unspecified: Secondary | ICD-10-CM

## 2012-12-01 DIAGNOSIS — J45909 Unspecified asthma, uncomplicated: Secondary | ICD-10-CM

## 2012-12-01 MED ORDER — DICLOFENAC SODIUM 75 MG PO TBEC: 75.0000 mg | DELAYED_RELEASE_TABLET | Freq: Two times a day (BID) | ORAL | Status: DC

## 2012-12-01 MED ORDER — AZITHROMYCIN 500 MG PO TABS: 500.0000 mg | ORAL_TABLET | Freq: Every day | ORAL | Status: DC

## 2012-12-01 NOTE — Patient Instructions (Signed)
For asthmatic bronchitis - begin 3 day course of Zithromax.  Continue Symbicort twice daily, albuterol inhaler as needed.  Rest, hydrate well, and if this is not improving, let me know.  Regarding the hip and leg pain.   Lets hold off on Mobic temporarily and begin Diclofenac twice daily as need for pain and inflammation.   Use ice and heat alternating to the leg.  Rest the leg for now.  Continue doing some stretches.  Avoid a lot of bending and squatting for the next week.  You can also use the Ultram you have, 1-2 tablets every 8 hours if needed for pain.   Alternate this with the Diclofenac by at least 4 hours.If this is not improving back to normal, then recheck.

## 2012-12-01 NOTE — Progress Notes (Signed)
Subjective: Here for pain in left hip radiating down left leg.  Thinks its sciatica.  Started Friday with 10/10 pain.  Using hot water soaks with no improvement, rest.  Used Ibuprofen 800mg  OTC with some relief, but not a lot.  Pain is 3-5/10 pain currently.  Continues to throb.  Can't put pressure on the left hip.  currently pain is more in lower left leg anteriorly, but over the weekend was more in lateral buttock.  Coughing would aggravate the pain.   Denies back or abdominal pain.  Denies nausea, vomiting, no dysuria, no blood in urine or stool.  No fever.  No incontinence of stool or urine.   No prior similar.  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  Denies any recent strenuous activity, no trauma, no injury.   Occasional has had restless leg syndrome problems, but this is intermittent.   Has been on medication for this prior.   Denies numbness or tingling in legs, but occasionally has cramps in left calf in the mornings.  She is a nonsmoker.  In general does not exercise regularly.  She does stretch every day.  She notes hx/o asthma and chronic sinus problems. Having a few week hx/o cough, productive cough, sinus pressure, sinus drainage that is colored.   Using mucinex, but not resolving.  Using nasal saline, using the flonase nasal, symbicort 2 puffs BID, albuterol currently not used but twice the last week.  Getting 300-400 on peak flow meter currently.  Baseline at rest without c/o is 550.   Past Medical History  Diagnosis Date  . Asthma   . Allergy   . Diverticulosis    ROS as in subjective  Objective: Gen: wd, wn, nad HEENT: head nontender, TMs pearly, nares patent, pharynx normal Oral MMM , no lesions Lung: few rhonchi, but mostly clear, no rales Heart RRR, normal S1, s2, no murmur MSK: left leg with mild anterior lower leg tenderness and mild tenderness over left lateral IT band, pain in left hip when lying flat, but no buttock or sciatic notch tenderness,  -SLR.  Rest of left leg, knee, hip with normal ROM with out deformity, nontender, no swelling Neuro: LE normal strength, sensation, DTRs Pulses pedal pulses normal Back: normal ROM, nontender  Assessment: Encounter Diagnoses  Name Primary?  . Leg pain Yes  . Asthmatic bronchitis   . Need for prophylactic vaccination and inoculation against influenza    Plan: Will treat for asthmatic bronchitis flare and leg pain.  Flu vaccine and VIS given by nurse.   Leg pain etiology not clear.  Does not seem suggestive of sciatica.  Possible hip bursitis vs tight IT band vs hip arthritis, but less likely sciatica.  Advised relative rest, stretching as discussed, heat, and begin diclofenac instead of mobic temporarily.  If not much better in 1 wk, then recheck.  Patient Instructions  For asthmatic bronchitis - begin 3 day course of Zithromax.  Continue Symbicort twice daily, albuterol inhaler as needed.  Rest, hydrate well, and if this is not improving, let me know.  Regarding the hip and leg pain.   Lets hold off on Mobic temporarily and begin Diclofenac twice daily as need for pain and inflammation.   Use ice and heat alternating to the leg.  Rest the leg for now.  Continue doing some stretches.  Avoid a lot of bending and squatting for the next week.  You can also use the Ultram you have, 1-2 tablets  every 8 hours if needed for pain.   Alternate this with the Diclofenac by at least 4 hours.If this is not improving back to normal, then recheck.

## 2012-12-04 ENCOUNTER — Encounter: Payer: Self-pay | Admitting: Family Medicine

## 2012-12-04 ENCOUNTER — Ambulatory Visit (INDEPENDENT_AMBULATORY_CARE_PROVIDER_SITE_OTHER): Payer: BC Managed Care – PPO | Admitting: Family Medicine

## 2012-12-04 VITALS — BP 132/80 | HR 72 | Ht 63.0 in | Wt 156.0 lb

## 2012-12-04 DIAGNOSIS — M25552 Pain in left hip: Secondary | ICD-10-CM

## 2012-12-04 DIAGNOSIS — M25559 Pain in unspecified hip: Secondary | ICD-10-CM

## 2012-12-04 MED ORDER — METHYLPREDNISOLONE 4 MG PO KIT: PACK | ORAL | Status: DC

## 2012-12-04 NOTE — Patient Instructions (Addendum)
Take the steroids as directed.  Stop the diclofenac and meloxicam.  You may use tylenol and ultram as needed for pain.  Follow up next week if you aren't improving, especially if you develop any weakness.

## 2012-12-04 NOTE — Progress Notes (Signed)
Chief Complaint  Patient presents with  . Hip Pain    saw Vincenza Hews this past Monday for left hip pain that is radiating down her leg, states she is zero percent better.  Was given diclofenac, still taking but is not helping.   HPI:  L hip pain x 6 days, radiating down L leg.  Epsom soaks didn't help. Ibuprofen helped make the pain tolerable until her visit with Vincenza Hews on Monday.  She was put on diclofenac, but it isn't helping.  Denies numbness/tingling down the leg.  Describes some knee giving out/weakness. She tried ultram for the pain, but it upset her stomach, vomited (when taking two at a time).  Pain is at left lateral hip, and radiates down leg, staying laterally all the way down the leg.  Denies rash/lesions.  4/10 pain now, throbbing.  Pain is worse with prolonged standing and walking (which she has been avoiding).  Occasionally gets very sharp pain, short-lived.  No known change in activity, trauma, injury or change in shoes.  She was also treated for asthmatic bronchitis, and completed azithromycin and is doing better.  Past Medical History  Diagnosis Date  . Asthma   . Allergy   . Diverticulosis    Past Surgical History  Procedure Date  . Nasal polyp surgery   . Colonoscopy     Dr. Elnoria Howard   History   Social History  . Marital Status: Married    Spouse Name: N/A    Number of Children: N/A  . Years of Education: N/A   Occupational History  . Principal at [employer]    Social History Main Topics  . Smoking status: Never Smoker   . Smokeless tobacco: Never Used  . Alcohol Use: Yes     Comment: 1 glass of wine twice a year.  . Drug Use: No  . Sexually Active: Not Currently   Other Topics Concern  . Not on file   Social History Narrative  . No narrative on file   Current Outpatient Prescriptions on File Prior to Visit  Medication Sig Dispense Refill  . fluticasone (FLONASE) 50 MCG/ACT nasal spray PLACE TWO SPRAYS INTO THE NOSE DAILY.  16 g  10  . SYMBICORT  160-4.5 MCG/ACT inhaler INHALE TWO PUFFS BY MOUTH INTO THE LUNGS  TWICE DAILY  11 g  4  . albuterol (PROAIR HFA) 108 (90 BASE) MCG/ACT inhaler Inhale 2 puffs into the lungs every 6 (six) hours as needed.  18 g  2  . meloxicam (MOBIC) 15 MG tablet TAKE ONE TABLET BY MOUTH DAILY  30 tablet  5   Allergies  Allergen Reactions  . Sulfa Antibiotics Hives   ROS:  Denies fevers, URI symptoms, chest pain.  Still having some mild pain from her shingles (L back and chest), stinging pain, daily. Shortness of breath/cough improved.  Not needing albuterol.  No further vomiting, no diarrhea, no abdominal pain.  PHYSICAL EXAM: BP 132/80  Pulse 72  Ht 5\' 3"  (1.6 m)  Wt 156 lb (70.761 kg)  BMI 27.63 kg/m2 Well appearing female in no distress  nontender at L trochanteric bursa.  Area of tenderness is just posterior to the bursa, in the muscles.  She has pain described in area of IT band, but is nontender in this area. nontender at ASIS.  Calves and hamstrings nontender, no swelling.  Normal strength, sensation.  Normal heel and toe walking.  Negative straight leg raise.  Slightly diminished DTR at L knee compared to R,  but still has 1+ reflex present.  ASSESSMENT/PLAN: 1. Hip pain, left  methylPREDNISolone (MEDROL, PAK,) 4 MG tablet   Offered steroids vs giving more time for NSAID.  Elected for steroids.  Aware of risks and side effects/reviewed. Continue heat/ice, additional stretches shown.  F/u next week.  If ongoing, consider imaging and PT.  Need to recheck reflexes.  If develops any weakness, would proceed directly to MRI.  Bronchitis--improving

## 2012-12-15 ENCOUNTER — Ambulatory Visit (INDEPENDENT_AMBULATORY_CARE_PROVIDER_SITE_OTHER): Payer: BC Managed Care – PPO | Admitting: Family Medicine

## 2012-12-15 ENCOUNTER — Encounter: Payer: Self-pay | Admitting: Family Medicine

## 2012-12-15 VITALS — BP 120/76 | HR 80 | Ht 63.0 in | Wt 156.0 lb

## 2012-12-15 DIAGNOSIS — M79609 Pain in unspecified limb: Secondary | ICD-10-CM

## 2012-12-15 DIAGNOSIS — M79605 Pain in left leg: Secondary | ICD-10-CM

## 2012-12-15 NOTE — Progress Notes (Signed)
Chief Complaint  Patient presents with  . Hip Pain    left hip pain and leg pain still not completely better, maybe somewhat better.   HPI: Patient presents for f/u on left hip pain.  She has taken course of methylprednisolone.  Pain is about 50% better.  Pain is in same areas, but less frequent (still daily), and less intense.  Continues to hurt at L hip, radiating down to below the knee, to ankle.  Denies numbness/tingling.  Has problems walking up the stairs, feels like it gives out--?due to pain vs weakness.  Hasn't need to use the tramadol in about 3 days (causing constipation, and pain improved).  Didn't have side effects to the steroids.  Has been able to use advil with some relief of her discomfort recently. She was also using her mobic daily. She has also been using heat, and icy/hot.  Denies abdominal pain or bleeding.  Pain in lateral calf, lateral thigh, and occasionally in hip.  Past Medical History  Diagnosis Date  . Asthma   . Allergy   . Diverticulosis    Past Surgical History  Procedure Date  . Nasal polyp surgery   . Colonoscopy     Dr. Elnoria Howard   History   Social History  . Marital Status: Married    Spouse Name: N/A    Number of Children: N/A  . Years of Education: N/A   Occupational History  . Principal at [employer]    Social History Main Topics  . Smoking status: Never Smoker   . Smokeless tobacco: Never Used  . Alcohol Use: Yes     Comment: 1 glass of wine twice a year.  . Drug Use: No  . Sexually Active: Not Currently   Other Topics Concern  . Not on file   Social History Narrative  . No narrative on file   Current outpatient prescriptions:fluticasone (FLONASE) 50 MCG/ACT nasal spray, PLACE TWO SPRAYS INTO THE NOSE DAILY., Disp: 16 g, Rfl: 10;  meloxicam (MOBIC) 15 MG tablet, TAKE ONE TABLET BY MOUTH DAILY, Disp: 30 tablet, Rfl: 5;  SYMBICORT 160-4.5 MCG/ACT inhaler, INHALE TWO PUFFS BY MOUTH INTO THE LUNGS  TWICE DAILY, Disp: 11 g, Rfl:  4 albuterol (PROAIR HFA) 108 (90 BASE) MCG/ACT inhaler, Inhale 2 puffs into the lungs every 6 (six) hours as needed., Disp: 18 g, Rfl: 2  Allergies  Allergen Reactions  . Sulfa Antibiotics Hives   ROS:  Denies fevers, URI symptoms, cough, shortness of breath, chest pain, nausea, vomiting, diarrhea, bleeding, bruising, rash, headache, dizziness or other concerns except as per HPI  PHYSICAL EXAM: BP 120/76  Pulse 80  Ht 5\' 3"  (1.6 m)  Wt 156 lb (70.761 kg)  BMI 27.63 kg/m2 Pleasant female, in no distress Exam unchanged--minimal tenderness at deep buttock muscles on left (much less than at last visit, and in slightly different location).  Some discomfort with pyriformis stretch, but full ROM. nontender at trochanteric bursa.  Spine nontender DTR's 2+ and symmetric, normal strength, sensation.  Normal gait.  ASSESSMENT/PLAN: 1. Left leg pain  Ambulatory referral to Orthopedic Surgery   ? lumbar radiculopathy.  some improvement with oral steroids, but persistent pain   Do not use Advil along with the meloxicam.  If taking meloxicam daily, then only use acetaminophen (tylenol products) for pain. (NSAID precautions reviewed).  Recommended holding off on MRI given improvement in symptoms lack of neurologic findings.  Recommended PT, and MRI and/or ortho eval if doesn't improve with PT.  Offered PT (strongly encouraged)--declined.  Prefers to have ortho eval first.

## 2012-12-15 NOTE — Patient Instructions (Addendum)
  Do not use Advil along with the meloxicam.  If taking meloxicam daily, then only use acetaminophen (tylenol products) for pain.  We are referring you to orthopedist.  Let us know if you don't hear anything about an appointment within the week

## 2012-12-28 ENCOUNTER — Other Ambulatory Visit: Payer: Self-pay | Admitting: Family Medicine

## 2013-01-19 ENCOUNTER — Encounter: Payer: Self-pay | Admitting: Medical

## 2013-01-19 ENCOUNTER — Ambulatory Visit
Admission: RE | Admit: 2013-01-19 | Discharge: 2013-01-19 | Disposition: A | Discharge status: Home / Self Care | Payer: BC Managed Care – PPO | Source: Ambulatory Visit | Attending: Medical | Admitting: Medical

## 2013-01-19 ENCOUNTER — Ambulatory Visit (INDEPENDENT_AMBULATORY_CARE_PROVIDER_SITE_OTHER): Payer: BC Managed Care – PPO | Admitting: Medical

## 2013-01-19 VITALS — BP 120/80 | HR 70 | Temp 98.0°F | Resp 20 | Wt 155.0 lb

## 2013-01-19 DIAGNOSIS — R05 Cough: Secondary | ICD-10-CM

## 2013-01-19 LAB — CBC WITH DIFFERENTIAL/PLATELET
HCT: 39 % (ref 36.0–46.0)
MCHC: 33.2 g/dL (ref 30.0–36.0)
Monocytes Absolute: 0.6 10*3/uL (ref 0.1–1.0)
Neutro Abs: 5.2 10*3/uL (ref 1.7–7.7)
RDW: 14.5 % (ref 11.5–15.5)

## 2013-01-19 NOTE — Progress Notes (Signed)
Subjective:  Kayla Henry is a 57 y.o. female who presents for illness.  3 days ago began having aches, chills, cough, subj fever, up to 101.  Hit her hard Friday evening.  Felt better the next day, but still has cough, sinus drainage.  Cough is green, yellow, at times brown specked with blood.   Lots of sinus pressure.  Denies ear pain, denies sore throat, no NV, but some mild diarrhea.  No abdominal pain, no back pain, no rash.   Used albuterol once last night with wheezing, but hasn't had to use the last few days otherwise.  Peak flow at home was in the 300s, but best is 550.  Using OTC medication which helps until it wears off.  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   Denies hx/o tobacco use.  No other aggravating or relieving factors.  No other c/o.  The following portions of the patient's history were reviewed and updated as appropriate: allergies, current medications, past family history, past medical history, past social history, past surgical history and problem list.  Past Medical History  Diagnosis Date  . Asthma   . Allergy   . Diverticulosis    ROS as in subjective  Objective: Vital signs reviewed  General appearance: Alert, WD/WN, no distress                             Skin: warm, no rash, no diaphoresis                           Head: no sinus tenderness                            Eyes: conjunctiva normal, corneas clear, PERRLA                            Ears: pearly TMs, external ear canals normal                          Nose: septum midline, turbinates swollen, left anterior nasal polyp present, 3mm diameter, mild erythema of turbinates, clear discharge             Mouth/throat: MMM, tongue normal, mild pharyngeal erythema, large torus on hard palate                           Neck: supple, no adenopathy, no thyromegaly, nontender                          Heart: RRR, normal S1, S2, no murmurs                         Lungs: decreased breath sounds, decreased  fremitus right lower fields, few faint wheezes,no rhonchi, no rales                Extremities: no edema, nontender, no clubbing     Assessment and Plan: Encounter Diagnoses  Name Primary?  . Cough Yes  . Fever, unspecified   . Asthma with acute exacerbation   . Nasal polyp    Will get CBC and CXR, help eval for pneumonia or other. Advised rest, good hydration, consider increasing albuterol to  2 puffs TID, and we will call with lab results and plan.

## 2013-05-12 ENCOUNTER — Encounter: Payer: Self-pay | Admitting: Family Medicine

## 2013-05-12 ENCOUNTER — Ambulatory Visit (INDEPENDENT_AMBULATORY_CARE_PROVIDER_SITE_OTHER): Payer: BC Managed Care – PPO | Admitting: Family Medicine

## 2013-05-12 VITALS — BP 120/80 | HR 87 | Wt 159.0 lb

## 2013-05-12 DIAGNOSIS — J4531 Mild persistent asthma with (acute) exacerbation: Secondary | ICD-10-CM

## 2013-05-12 DIAGNOSIS — J209 Acute bronchitis, unspecified: Secondary | ICD-10-CM

## 2013-05-12 DIAGNOSIS — J45901 Unspecified asthma with (acute) exacerbation: Secondary | ICD-10-CM

## 2013-05-12 MED ORDER — LEVOFLOXACIN 500 MG PO TABS: 500.0000 mg | ORAL_TABLET | Freq: Every day | ORAL | Status: DC

## 2013-05-12 NOTE — Progress Notes (Signed)
  Subjective:    Patient ID: Kayla Henry, female    DOB: 01/18/1956, 57 y.o.   MRN: [id number]  HPI She has a three-week history of chest congestion with a productive cough, worse in the last 10 daysalso with some shortness of breath. No fever, chills, sore throat or earache. She does not smoke. She does take Symbicort and Flonase daily. He has been using albuterol twice per night for the last week or so.    Review of Systems     Objective:   Physical Exam alert and in no distress. Tympanic membranes and canals are normal. Throat is clear. Tonsils are normal. Neck is supple without adenopathy or thyromegaly. Cardiac exam shows a regular sinus rhythm without murmurs or gallops. Lungs show expiratory wheezing        Assessment & Plan:  Asthma exacerbation, mild persistent  Acute bronchitis - Plan: levofloxacin (LEVAQUIN) 500 MG tablet recommend she use her Proventil inhaler as often as needed. Suggested that this should slowly diminish as the infection improves. She will call if any problems.

## 2013-05-18 ENCOUNTER — Telehealth: Payer: Self-pay | Admitting: Family Medicine

## 2013-05-18 NOTE — Telephone Encounter (Signed)
Schedule her to come in tomorrow for a followup appointment

## 2013-05-18 NOTE — Telephone Encounter (Signed)
PT HAS APPOINTMENT 7/1

## 2013-05-19 ENCOUNTER — Other Ambulatory Visit: Payer: Self-pay

## 2013-05-19 ENCOUNTER — Ambulatory Visit
Admission: RE | Admit: 2013-05-19 | Discharge: 2013-05-19 | Disposition: A | Discharge status: Home / Self Care | Payer: BC Managed Care – PPO | Source: Ambulatory Visit | Attending: Family Medicine | Admitting: Family Medicine

## 2013-05-19 ENCOUNTER — Ambulatory Visit (INDEPENDENT_AMBULATORY_CARE_PROVIDER_SITE_OTHER): Payer: BC Managed Care – PPO | Admitting: Family Medicine

## 2013-05-19 ENCOUNTER — Encounter: Payer: Self-pay | Admitting: Family Medicine

## 2013-05-19 VITALS — BP 140/80 | HR 78 | Wt 156.0 lb

## 2013-05-19 DIAGNOSIS — J209 Acute bronchitis, unspecified: Secondary | ICD-10-CM

## 2013-05-19 LAB — CBC WITH DIFFERENTIAL/PLATELET
Eosinophils Absolute: 1.3 10*3/uL — ABNORMAL HIGH (ref 0.0–0.7)
Hemoglobin: 12.3 g/dL (ref 12.0–15.0)
Lymphocytes Relative: 33 % (ref 12–46)
Lymphs Abs: 2.7 10*3/uL (ref 0.7–4.0)
MCH: 28 pg (ref 26.0–34.0)
Monocytes Relative: 5 % (ref 3–12)
Neutro Abs: 3.7 10*3/uL (ref 1.7–7.7)
Neutrophils Relative %: 45 % (ref 43–77)
RBC: 4.4 MIL/uL (ref 3.87–5.11)

## 2013-05-19 MED ORDER — METHYLPREDNISOLONE SODIUM SUCC 125 MG IJ SOLR
125.0000 mg | Freq: Once | INTRAMUSCULAR | Status: AC
  Administered 2013-05-19: 125 mg via INTRAMUSCULAR

## 2013-05-19 MED ORDER — PREDNISONE 10 MG PO KIT: PACK | ORAL | Status: DC

## 2013-05-19 MED ORDER — SODIUM CHLORIDE 0.9 % IV SOLN: 125.0000 mg | Freq: Once | INTRAVENOUS | Status: DC

## 2013-05-19 NOTE — Patient Instructions (Signed)
Take all medicine and keep in touch with me

## 2013-05-19 NOTE — Progress Notes (Signed)
  Subjective:    Patient ID: Kayla Henry, female    DOB: [birth year], 57 y.o.   MRN: [id number]  HPI She is here for recheck. She continues had difficulty with cough and congestion. She states that she has made no real progress. She is using her rescue inhaler regularly.   Review of Systems     Objective:   Physical Exam Alert and slightly tachypneic. Lungs show expiratory and inspiratory wheezing.  She was given an albuterol treatment and sent for chest x-ray. The x-ray showed no changes.      Assessment & Plan:   Acute bronchitis - Plan: CBC with Differential, DG Chest 2 View, PR ALBUTEROL IPRATROP NON-COMP, PR INHAL RX, AIRWAY OBST/DX SPUTUM INDUCT, PR ALBUTEROL IPRATROP NON-COMP, PR ALBUTEROL IPRATROP NON-COMP, PR INHAL RX, AIRWAY OBST/DX SPUTUM INDUCT, PR ALBUTEROL IPRATROP NON-COMP, methylPREDNISolone sodium succinate (SOLU-MEDROL) 130 mg in sodium chloride 0.9 % 50 mL IVPB, methylPREDNISolone sodium succinate (SOLU-MEDROL) 125 mg/2 mL injection 125 mg

## 2013-05-20 NOTE — Progress Notes (Signed)
Quick Note:  CALLED PT TO INFORM HER WORD FOR WORD The blood work looks good. Have her check with [place] next week PT VERBALIZED UNDERSTANDING ______

## 2013-06-23 ENCOUNTER — Encounter: Payer: Self-pay | Admitting: Medical

## 2013-06-23 ENCOUNTER — Ambulatory Visit (INDEPENDENT_AMBULATORY_CARE_PROVIDER_SITE_OTHER): Payer: BC Managed Care – PPO | Admitting: Medical

## 2013-06-23 VITALS — BP 118/72 | HR 72 | Temp 97.7°F | Resp 20 | Wt 160.0 lb

## 2013-06-23 DIAGNOSIS — J209 Acute bronchitis, unspecified: Secondary | ICD-10-CM

## 2013-06-23 DIAGNOSIS — J45909 Unspecified asthma, uncomplicated: Secondary | ICD-10-CM

## 2013-06-23 MED ORDER — FLUTICASONE-SALMETEROL 250-50 MCG/DOSE IN AEPB: 1.0000 | INHALATION_SPRAY | Freq: Two times a day (BID) | RESPIRATORY_TRACT | Status: DC

## 2013-06-23 MED ORDER — METHYLPREDNISOLONE SODIUM SUCC 125 MG IJ SOLR: 125.0000 mg | Freq: Once | INTRAMUSCULAR | Status: DC

## 2013-06-23 MED ORDER — MONTELUKAST SODIUM 10 MG PO TABS: 10.0000 mg | ORAL_TABLET | Freq: Every day | ORAL | Status: DC

## 2013-06-23 MED ORDER — ALBUTEROL SULFATE HFA 108 (90 BASE) MCG/ACT IN AERS: 2.0000 | INHALATION_SPRAY | Freq: Four times a day (QID) | RESPIRATORY_TRACT | Status: DC | PRN

## 2013-06-23 MED ORDER — LEVOFLOXACIN 500 MG PO TABS: 500.0000 mg | ORAL_TABLET | Freq: Every day | ORAL | Status: DC

## 2013-06-23 NOTE — Addendum Note (Signed)
Addended by: Laureen Ochs F on: 06/23/2013 03:39 PM   Modules accepted: Orders

## 2013-06-23 NOTE — Patient Instructions (Signed)
For acute asthmatic bronchitis  Begin Levaquin 500mg  daily for 7 days  We gave you a shot of Solumedrol steroid today  Rest, drink plenty of water  For the next 5-7 days, use the albuterol rescue inhaler 2-3 times daily  For asthma control  Begin Advair 250/50mg  discus, 1 puff twice daily.  Rinse mouth out with water after use  Stop Symbicort for now  Begin Singular 10mg  daily tablet  Recheck in 3-4 weeks.

## 2013-06-23 NOTE — Progress Notes (Signed)
Subjective:  Kayla Henry is a 57 y.o. female who presents for chest congestion.  Was here in June for chest congestion and asthma.   At that time was seen twice for chest congestion and cough, got some better with antibiotic, inhaler, and prednisone.   Still having cough, coughing up thick white and yellow mucous.  Coughs often through the day.  Feels like the cough has never completely cleared since June.  Feels drainage, some sore throat, some left pressure, sinus pressure, +post nasal drainage.  Sometimes SOB and wheezing.   No fever, no chills, no NVD, no CP, no edema.  She is a nonsmoker.  She is using the Symbicort 2 puffs BID.  No sick contacts.  Using OTC cough medication.  No other aggravating or relieving factors.  No other c/o.  The following portions of the patient's history were reviewed and updated as appropriate: allergies, current medications, past family history, past medical history, past social history, past surgical history and problem list.  Past Medical History  Diagnosis Date  . Asthma   . Allergy   . Diverticulosis    ROS as in subjective   Objective: Vital signs reviewed  Pulse ox 96% on room air today   General appearance: Alert, WD/WN, no distress                             Skin: warm, no rash, no diaphoresis                           Head: mild maxillary sinus tenderness                            Eyes: conjunctiva normal, corneas clear, PERRLA                            Ears: pearly TMs, external ear canals normal                          Nose: septum midline, turbinates swollen, no erythema but clear discharge             Mouth/throat: MMM, tongue normal, no pharyngeal erythema                           Neck: supple, no adenopathy, no thyromegaly, nontender, no JVD                          Heart: RRR, normal S1, S2, no murmurs                         Lungs: +bronchial breath sounds, +scattered rhonchi,faint wheezes, no rales                Extremities:  no edema, nontender     Assessment and Plan: Encounter Diagnoses  Name Primary?  Marland Kitchen Unspecified asthma(493.90) Yes  . Acute bronchitis    Begin round of Levaquin, 125mg  Solumedrol given IM in the office.   Advised rest, increased water intake.  We will try a change in her regimen given her concerns.   Begin Advair 250/50mg  1 puff BID, stop Symbicort for now.  Use albuterol rescue inhaler 2-3 times daily this  week, then prn.   Begin Singulair.  Recheck 1-14mo, sooner prn.  Discussed her current medicine regimen vs the new regimen as a trial.  If not improving, will need to consider pulmonology referral.  Reviewed CBC and CXR from 05/2013.

## 2013-06-25 ENCOUNTER — Telehealth: Payer: Self-pay | Admitting: Medical

## 2013-06-25 MED ORDER — METHYLPREDNISOLONE SODIUM SUCC 125 MG IJ SOLR
125.0000 mg | Freq: Once | INTRAMUSCULAR | Status: AC
  Administered 2013-06-25: 125 mg via INTRAMUSCULAR

## 2013-06-25 NOTE — Telephone Encounter (Signed)
Cancel message °

## 2013-06-25 NOTE — Addendum Note (Signed)
Addended by: Laureen Ochs F on: 06/25/2013 11:46 AM   Modules accepted: Orders

## 2013-07-28 ENCOUNTER — Encounter: Payer: Self-pay | Admitting: Medical

## 2013-07-28 ENCOUNTER — Ambulatory Visit (INDEPENDENT_AMBULATORY_CARE_PROVIDER_SITE_OTHER): Payer: BC Managed Care – PPO | Admitting: Medical

## 2013-07-28 VITALS — BP 120/78 | HR 74 | Temp 97.7°F | Resp 16 | Wt 162.0 lb

## 2013-07-28 DIAGNOSIS — J45909 Unspecified asthma, uncomplicated: Secondary | ICD-10-CM

## 2013-07-28 DIAGNOSIS — R062 Wheezing: Secondary | ICD-10-CM

## 2013-07-28 DIAGNOSIS — R0609 Other forms of dyspnea: Secondary | ICD-10-CM

## 2013-07-28 MED ORDER — METHYLPREDNISOLONE SODIUM SUCC 125 MG IJ SOLR
125.0000 mg | Freq: Once | INTRAMUSCULAR | Status: AC
  Administered 2013-07-28: 125 mg via INTRAMUSCULAR

## 2013-07-28 MED ORDER — AZITHROMYCIN 250 MG PO TABS: ORAL_TABLET | ORAL | Status: DC

## 2013-07-28 NOTE — Progress Notes (Addendum)
Subjective: Here for cough, wheezing.   Been coughing, and it has been productive, has went from yellow to brown now.  Is wheezing, SOB.  Feels like she never fully cleared up from last visit a month ago.  Got better, but cough and wheezing never really improved.  Current symptoms have worsened over the last few days.  Cough is getting progressively worse.  Sometimes gets nauseated from phlegm, mild sore throat, chest congestion .  Denies fever, sinus pressure, ear pain, no head congestion.  No sick contacts.  Using OTC cough syrup.  Using Advair BID, Singulair, flonase.  Using albuterol a few times day and night for last few days.  She does have nebulizer at home but has not used it recently.  Denies chest pain or leg swelling.  She was low on peak flow at home the last several days.  In generally she notes the last few years with more frequent asthma flares, time between visits gets shorter due to flare ups.  diagnosed with asthma 30 years ago.    Current regimen: Last visit we switched from Symbicort to Advair as a trial She has nebulized albuterol but not using it Uses Albuterol rescue BID at present Takes Singulair daily Takes flonase daily  She denies GERD problems or symptoms.  Eats some spicy foods, but not often.  She denies recent long travel, trauma, no hx/o DVT or PE, no chest pain, no leg edema.  Otherwise in normal state of health.  No major allergen exposures.  Has not pets, not around smoke or dust often.  Last pulmonology/asthma specialist visit years ago.  Has been to [facility] in remote past, has been to asthma/allergy specialist in the past.  Doesn't think she has ever been to pulmonologist specifically.  Past Medical History  Diagnosis Date  . Asthma   . Allergy   . Diverticulosis    ROS as in subjective  Objective: Filed Vitals:   07/28/13 1551  BP: 120/78  Pulse: 74  Temp: 97.7 F (36.5 C)  Resp: 16    General appearance: alert, no distress, WD/WN HEENT:  normocephalic, sclerae anicteric, TMs pearly, nares patent, no discharge or erythema, pharynx normal Oral cavity: MMM, large palatine , no lesions Neck: supple, no lymphadenopathy, no thyromegaly, no masses, no JVD Heart: RRR, normal S1, S2, no murmurs Lungs: faint wheezes, no rhonchi, no rales Pulses: 2+ symmetric, upper and lower extremities, normal cap refill Ext: no edema, no calve tenderness or swelling   Adult ECG Report  Indication: wheezing/SOB  Rate: 58bpm  Rhythm: sinus bradycardia  QRS Axis: 56 degrees  PR Interval:  QRS Duration: 72ms  QTc:  Conduction Disturbances: none  Other Abnormalities: none  Patient's cardiac risk factors are: none.  EKG comparison: 02/12/07 was normal, sinus bradycardia now  Narrative Interpretation: sinus bradycardia, otherwise normal EKG   CHEST - 2 VIEW 05/19/13 Comparison: Chest x-ray of 01/19/2013  Findings: No active infiltrate or effusion is seen. Mild  peribronchial thickening is noted which can be seen with  bronchitis. Mediastinal contours appear normal. The heart is  within normal limits in size. No bony abnormality is seen.  IMPRESSION:  No active lung disease. Peribronchial thickening may indicate  bronchitis   CBC    Component Value Date/Time   WBC 8.2 05/19/2013 1403   RBC 4.40 05/19/2013 1403   HGB 12.3 05/19/2013 1403   HCT 36.9 05/19/2013 1403   PLT 263 05/19/2013 1403   MCV 83.9 05/19/2013 1403   MCH  28.0 05/19/2013 1403   MCHC 33.3 05/19/2013 1403   RDW 14.4 05/19/2013 1403   LYMPHSABS 2.7 05/19/2013 1403   MONOABS 0.4 05/19/2013 1403   EOSABS 1.3* 05/19/2013 1403   BASOSABS 0.1 05/19/2013 1403     Assessment: Encounter Diagnoses  Name Primary?  Marland Kitchen Unspecified asthma(493.90) Yes  . Wheezing      Plan: Gave 125mg  IM Solu Medrol in office.  Will begin zpak, begin Mucinex DM, c/t routine regimen, but begin Zyrtec daily, increase water intake.   Given her more frequent visits for asthma flare despite compliance with  medication regimen, we will refer to pulmonology for further eval and management.  She has seen asthma and allergy clinic in remote past.  There are no recent PFTs.  Follow-up - referred to pulmonology

## 2013-07-28 NOTE — Patient Instructions (Addendum)
In addition to your normal medicatoin regimen,   Consider adding Zyrtec or Allegra once daily at bedtime  Short term for 5 days add Mucinex DM  Increase water intake  Increase albuterol rescue inhaler to 2 puffs 3-4 times daily this week  Begin Zpak antibiotic.   If not improving by Friday, let me know.  We will refer you to pulmonology

## 2013-07-29 NOTE — Addendum Note (Signed)
Addended by: Barbette Or A on: 07/29/2013 01:20 PM   Modules accepted: Orders

## 2013-07-30 NOTE — Addendum Note (Signed)
Addended by: Jac Canavan on: 07/30/2013 08:04 PM   Modules accepted: Orders

## 2013-08-31 ENCOUNTER — Encounter: Payer: Self-pay | Admitting: Pulmonary Disease

## 2013-08-31 ENCOUNTER — Ambulatory Visit (INDEPENDENT_AMBULATORY_CARE_PROVIDER_SITE_OTHER): Payer: BC Managed Care – PPO | Admitting: Pulmonary Disease

## 2013-08-31 ENCOUNTER — Other Ambulatory Visit: Payer: BC Managed Care – PPO

## 2013-08-31 VITALS — BP 122/80 | HR 77 | Temp 97.9°F | Ht 63.0 in | Wt 161.2 lb

## 2013-08-31 DIAGNOSIS — J45901 Unspecified asthma with (acute) exacerbation: Secondary | ICD-10-CM

## 2013-08-31 DIAGNOSIS — J329 Chronic sinusitis, unspecified: Secondary | ICD-10-CM

## 2013-08-31 DIAGNOSIS — J45909 Unspecified asthma, uncomplicated: Secondary | ICD-10-CM

## 2013-08-31 MED ORDER — PREDNISONE 10 MG PO TABS: ORAL_TABLET | ORAL | Status: DC

## 2013-08-31 MED ORDER — LEVOFLOXACIN 750 MG PO TABS: 750.0000 mg | ORAL_TABLET | Freq: Every day | ORAL | Status: DC

## 2013-08-31 NOTE — Patient Instructions (Addendum)
Stop advair, and will give you a trial of dulera 200/5  2 inhalations am and pm everyday.  Rinse mouth well. Will check scan of your sinuses to see if changes of chronic sinusitis.  Will get records from your prior ENT. Will do bloodwork today for allergy testing, and will get records from your prior allergist. levaquin 750mg  one each day for 10 days.  Prednisone taper over 8 days. Will call once test results are available.

## 2013-08-31 NOTE — Addendum Note (Signed)
Addended by: Maisie Fus on: 08/31/2013 04:09 PM   Modules accepted: Orders

## 2013-08-31 NOTE — Assessment & Plan Note (Signed)
The patient currently is having symptoms consistent with acute on chronic sinusitis.  We'll treat her with a course of antibiotics, but will also get a scan of her sinuses to rule out anatomic abnormalities.

## 2013-08-31 NOTE — Assessment & Plan Note (Signed)
The patient has increased shortness of breath, cough, and evidence for early bronchospasm on exam today.  She also has severe airflow obstruction on spirometry.  She will need a course of prednisone to get her through this episode.

## 2013-08-31 NOTE — Assessment & Plan Note (Signed)
The patient gives a history of long-standing asthma that is under very poor control over the years by her description.  This is occurring in the face of good medication such as Advair and Singulair.  I suspect that chronic sinusitis and allergic disease are contributing to her poorly controlled asthma.  I would like to intensify her medication regimen, but will also check a CT of her sinuses and rast testing.  Depending upon the results, she may be a good candidate for Xolair.  In the meantime, we will treat her current exacerbation.

## 2013-08-31 NOTE — Progress Notes (Signed)
  Subjective:    Patient ID: Kayla Henry, female    DOB: May 26, 1956, 57 y.o.   MRN: [id number]  HPI The patient is a 57 year old female who I have been asked to see for management of asthma.  She was diagnosed approximately 20 years ago, and at that time she was having chronic cough as well as shortness of breath.  She is not sure if she ever had spirometry, but she was put on inhalers and feels that it did help her.  Despite being on medications, she would still have 3-4 flareups a year, and would end up in the hospital twice a year.  Over the last 2 years, she states that she has about 4-5 flareups a year but is able to stay out of the emergency room in the hospital.  She typically goes on prednisone, and has significant improvement.  All of this occurs despite staying compliant on that ear and also Singulair.  She tells me that when she is not having a flare up, she feels that her breathing is normal and rarely uses albuterol.  She was allergy tested approximately 10 years ago, and was on allergy vaccine for a few years.  This was discontinued because she was doing better.  She also has a history of recurrent sinus infections, and tells me that she has had polyps removed years ago at [facility].  She denies having consistent reflux symptoms.  She has not had prednisone for at least 2 months, and currently is having postnasal drip as well as discolored mucus from her nasal passages.  She has sinus pressure as well as increased cough over her usual baseline.  She has had a chest x-ray in July which showed minimal parabronchial thickening, and otherwise was clear.  She has had a recent CBC that shows minimal increase in her absolute eosinophil count.   Review of Systems  Constitutional: Negative for fever and unexpected weight change.  HENT: Positive for congestion, postnasal drip, rhinorrhea, sinus pressure and sneezing. Negative for dental problem, ear pain, nosebleeds, sore throat and trouble  swallowing.   Eyes: Negative for redness and itching.  Respiratory: Positive for cough ( little hemoptysis), chest tightness, shortness of breath and wheezing.   Cardiovascular: Negative for palpitations and leg swelling.  Gastrointestinal: Negative for nausea and vomiting.  Genitourinary: Negative for dysuria.  Musculoskeletal: Negative for joint swelling.  Skin: Negative for rash.  Neurological: Positive for headaches.  Hematological: Does not bruise/bleed easily.  Psychiatric/Behavioral: Negative for dysphoric mood. The patient is not nervous/anxious.        Objective:   Physical Exam Constitutional:  Well developed, no acute distress  HENT:  Nares patent without discharge, mild septal deviation to the left.  Oropharynx without exudate, palate and uvula are mildly elongated.  Eyes:  Perrla, eomi, no scleral icterus  Neck:  No JVD, no TMG  Cardiovascular:  Normal rate, regular rhythm, no rubs or gallops.  No murmurs        Intact distal pulses  Pulmonary :  Decreased bs, no stridor or respiratory distress   No rales, +rhonchi and squeaks, no wheezing.   Abdominal:  Soft, nondistended, bowel sounds present.  No tenderness noted.   Musculoskeletal:  No lower extremity edema noted.  Lymph Nodes:  No cervical lymphadenopathy noted  Skin:  No cyanosis noted  Neurologic:  Alert, appropriate, moves all 4 extremities without obvious deficit.         Assessment & Plan:

## 2013-09-01 ENCOUNTER — Ambulatory Visit (INDEPENDENT_AMBULATORY_CARE_PROVIDER_SITE_OTHER)
Admission: RE | Admit: 2013-09-01 | Discharge: 2013-09-01 | Disposition: A | Discharge status: Home / Self Care | Payer: BC Managed Care – PPO | Source: Ambulatory Visit | Attending: Pulmonary Disease | Admitting: Pulmonary Disease

## 2013-09-01 DIAGNOSIS — J45901 Unspecified asthma with (acute) exacerbation: Secondary | ICD-10-CM

## 2013-09-01 DIAGNOSIS — J329 Chronic sinusitis, unspecified: Secondary | ICD-10-CM

## 2013-09-01 LAB — ALLERGY FULL PROFILE
Allergen, D pternoyssinus,d7: <0.1 kU/L
Allergen,Goose feathers, e70: <0.1 kU/L
Aspergillus fumigatus, m3: 0.13 kU/L — ABNORMAL HIGH
Box Elder IgE: <0.1 kU/L
Candida Albicans: <0.1 kU/L
Cat Dander: <0.1 kU/L
Common Ragweed: <0.1 kU/L
D. farinae: <0.1 kU/L
Elm IgE: <0.1 kU/L
Fescue: <0.1 kU/L
G009 Red Top: <0.1 kU/L
Goldenrod: <0.1 kU/L
House Dust Hollister: <0.1 kU/L
Oak: <0.1 kU/L
Plantain: <0.1 kU/L
Timothy Grass: <0.1 kU/L

## 2013-09-02 ENCOUNTER — Other Ambulatory Visit: Payer: Self-pay | Admitting: Family Medicine

## 2013-09-03 NOTE — Telephone Encounter (Signed)
Is this ok?

## 2013-09-04 ENCOUNTER — Other Ambulatory Visit: Payer: BC Managed Care – PPO

## 2013-09-04 ENCOUNTER — Other Ambulatory Visit: Payer: Self-pay | Admitting: Pulmonary Disease

## 2013-09-04 DIAGNOSIS — J329 Chronic sinusitis, unspecified: Secondary | ICD-10-CM

## 2013-09-07 LAB — ALLERGY FULL PROFILE
Allergen, D pternoyssinus,d7: <0.1 kU/L
Allergen,Goose feathers, e70: <0.1 kU/L
Alternaria Alternata: <0.1 kU/L
Aspergillus fumigatus, m3: 0.14 kU/L — ABNORMAL HIGH
Box Elder IgE: <0.1 kU/L
Cat Dander: <0.1 kU/L
Common Ragweed: <0.1 kU/L
D. farinae: <0.1 kU/L
Dog Dander: <0.1 kU/L
G005 Rye, Perennial: <0.1 kU/L
Goldenrod: <0.1 kU/L
Helminthosporium halodes: <0.1 kU/L
House Dust Hollister: <0.1 kU/L
IgE (Immunoglobulin E), Serum: 219.9 IU/mL — ABNORMAL HIGH (ref 0.0–180.0)
Lamb's Quarters: <0.1 kU/L
Oak: <0.1 kU/L
Plantain: <0.1 kU/L
Stemphylium Botryosum: <0.1 kU/L

## 2013-09-08 ENCOUNTER — Telehealth: Payer: Self-pay | Admitting: Pulmonary Disease

## 2013-09-08 NOTE — Telephone Encounter (Signed)
Noted  

## 2013-09-08 NOTE — Telephone Encounter (Signed)
Notes Recorded by Barbaraann Share, MD on 09/07/2013 at 9:12 AM Let pt know that her blood allergy panel is negative except for one fungus that is everywhere. Is not a big abnormality. Would like to see how she does with higher dose of inhaler, and getting her sinuses treated. Would like to see her back in 6 weeks, but call if not doing well.  Called and spoke with pt and notified her of results/recs per [facility] She verbalized understanding and denied any questions I offered to set up the 6 wk appt, but she declined and stated will call back to schedule this  Will forward to Ashtyn so she is aware result was given since I do not have access to her inbasket, thanks

## 2013-09-10 ENCOUNTER — Encounter: Payer: Self-pay | Admitting: *Deleted

## 2013-09-16 ENCOUNTER — Telehealth: Payer: Self-pay | Admitting: Pulmonary Disease

## 2013-09-16 ENCOUNTER — Encounter: Payer: Self-pay | Admitting: *Deleted

## 2013-09-16 NOTE — Telephone Encounter (Signed)
I spoke with pt. She was advised of her results on 09/08/13 and then another nurse sent pt a [facility] message stating to contact us for results. I advised her it was a mistake and apologized. Will sign off message

## 2013-09-24 ENCOUNTER — Other Ambulatory Visit: Payer: Self-pay

## 2013-10-29 ENCOUNTER — Encounter: Payer: Self-pay | Admitting: Family Medicine

## 2013-10-29 ENCOUNTER — Ambulatory Visit (INDEPENDENT_AMBULATORY_CARE_PROVIDER_SITE_OTHER): Payer: BC Managed Care – PPO | Admitting: Family Medicine

## 2013-10-29 VITALS — BP 158/96 | HR 72 | Temp 97.8°F | Ht 63.0 in | Wt 161.0 lb

## 2013-10-29 DIAGNOSIS — J0191 Acute recurrent sinusitis, unspecified: Secondary | ICD-10-CM

## 2013-10-29 DIAGNOSIS — Z23 Encounter for immunization: Secondary | ICD-10-CM

## 2013-10-29 DIAGNOSIS — J329 Chronic sinusitis, unspecified: Secondary | ICD-10-CM

## 2013-10-29 DIAGNOSIS — J019 Acute sinusitis, unspecified: Secondary | ICD-10-CM

## 2013-10-29 DIAGNOSIS — G2581 Restless legs syndrome: Secondary | ICD-10-CM

## 2013-10-29 MED ORDER — ROPINIROLE HCL 0.25 MG PO TABS: 0.2500 mg | ORAL_TABLET | Freq: Every day | ORAL | Status: DC

## 2013-10-29 MED ORDER — MOXIFLOXACIN HCL 400 MG PO TABS: 400.0000 mg | ORAL_TABLET | Freq: Every day | ORAL | Status: DC

## 2013-10-29 NOTE — Patient Instructions (Addendum)
RLS--restart requip.  F/u if symptoms do not improve for further discussion and evaluation. Take medication every night. If after two weeks you aren't getting improvement, you can take 2 every night (total dose of 0.5mg ).  If this dose is effective, call to have prescription changed to higher dose.  Avelox for 2 weeks Continue sinus rinses daily, longterm Start mucinex twice daily Continue Flonase If symptoms/infection recur, refer to ENT for further evaluation.

## 2013-10-29 NOTE — Progress Notes (Signed)
Chief Complaint  Patient presents with  . Cough    sinus pressure and pain, facial pain. Nasal passages are very stopped up. Couging that is productive-mucus is anywhere from clear to white, yellow/green to brown at times. This has been going on for about a week. Has noticed some slight SOB over the last few days when she walks or over exerts. She would like a flu shot and a pneumonia shot ,wanted to check with Dr.Dai Apel before giving.  . Insomnia    has been having difficulty sleeping for many years as well as RLS. Wanted to discuss this today-I did let her know that she is scheduled for an acute visit  today and Dr.Bubber Rothert may or may not be able to address these issues-may need separate appt.     Started a week ago with stuffy nose, which progressed to tightness in her chest, sinus congestion, headaches, pain in both cheeks.  She has been using OTC decongestant and doing sinus rinses.  Mucus has been yellow.  Denies any fevers.  Chest has been tight in the last couple of days, with some dyspnea with exertion.  She has not used her albuterol for these symptoms, is relieved by resting.  She has been getting frequent recurrences of sinus infections, and has been seeing Dr. Shelle Iron. Review of chart shows that she was treated with Levaquin in June, August and October.  Biaxin wasn't effective in the past, and she is allergic to cephalosporins and Sulfa.   CT scan in October revealed:  IMPRESSION:  1. Postsurgical changes of bilateral maxillary antrostomies and  partial ethmoidectomies.  2. Residual diffuse mucosal thickening in the maxillary sinuses  bilaterally with a fluid level on the right suggesting acute on  chronic disease.  3. Diffuse chronic bone changes.  4. Chronic opacification of the anterior ethmoid air cells and  frontal sinuses bilaterally.   RLS--discussed with Dr. Susann Givens in 08/2011.  She was prescribed Requip, but admits that she never took it regularly, just prn, unsure how well  it worked like that.  She is having more problems with the restless legs interfering with sleep.  Past Medical History  Diagnosis Date  . Asthma   . Allergy   . Diverticulosis   . RLS (restless legs syndrome)   . Chronic sinusitis    Past Surgical History  Procedure Laterality Date  . Nasal polyp surgery    . Colonoscopy      Dr. Elnoria Howard   History   Social History  . Marital Status: Married    Spouse Name: N/A    Number of Children: N/A  . Years of Education: N/A   Occupational History  . Principal at [employer]    Social History Main Topics  . Smoking status: Never Smoker   . Smokeless tobacco: Never Used  . Alcohol Use: Yes     Comment: 1 glass of wine twice a year.  . Drug Use: No  . Sexual Activity: Not Currently   Other Topics Concern  . Not on file   Social History Narrative  . No narrative on file    Allergies  Allergen Reactions  . Cephalosporins     hives  . Sulfa Antibiotics Hives   ROS: denies fevers, chills, nausea, vomiting, rash.  +RLS, dyspnea, cough, sinus pain.  No bleeding, bruising, dizziness.  +sinus headaches.  Denies chest pain, palpitations.  PHYSICAL EXAM: BP 158/96  Pulse 72  Temp(Src) 97.8 F (36.6 C) (Oral)  Ht 5\' 3"  (1.6  m)  Wt 161 lb (73.029 kg)  BMI 28.53 kg/m2 Well appearing female, in no distress.  Occasional dry cough.  Speaking easily in full sentences HEENT:  PERRL, EOMI, conjunctiva clear. Nasal mucosa is mod-severely edematous, erythema with thick yellow mucus in both nares Mildly tender over both maxillary sinuses.  OP clear Neck: no lymphadenopathy or mass Heart: regular rate and rhythm without murmur Lungs: clear with fair air movement.  Some dry cough with deep breaths, no wheezes with forced expiration  ASSESSMENT/PLAN:  Recurrent acute sinusitis - Plan: moxifloxacin (AVELOX) 400 MG tablet  RLS (restless legs syndrome) - Plan: rOPINIRole (REQUIP) 0.25 MG tablet  Need for prophylactic vaccination and  inoculation against influenza - Plan: Flu Vaccine QUAD 36+ mos IM  Sinusitis, chronic  Acute (likely on chronic) maxillary sinusitis.   Avelox for 2 weeks Continue sinus rinses daily, longterm Start mucinex twice daily Continue Flonase, reviewed proper use If symptoms/infection recur, refer to ENT for further evaluation.  RLS--restart requip.  F/u if symptoms do not improve for further discussion and evaluation. Take medication every night.

## 2013-11-09 ENCOUNTER — Telehealth: Payer: Self-pay | Admitting: Internal Medicine

## 2013-11-09 ENCOUNTER — Ambulatory Visit (INDEPENDENT_AMBULATORY_CARE_PROVIDER_SITE_OTHER): Payer: BC Managed Care – PPO | Admitting: Family Medicine

## 2013-11-09 ENCOUNTER — Encounter: Payer: Self-pay | Admitting: Family Medicine

## 2013-11-09 VITALS — BP 130/84 | HR 72 | Temp 99.2°F | Ht 63.0 in | Wt 160.0 lb

## 2013-11-09 DIAGNOSIS — R52 Pain, unspecified: Secondary | ICD-10-CM

## 2013-11-09 DIAGNOSIS — R509 Fever, unspecified: Secondary | ICD-10-CM

## 2013-11-09 DIAGNOSIS — J101 Influenza due to other identified influenza virus with other respiratory manifestations: Secondary | ICD-10-CM

## 2013-11-09 DIAGNOSIS — R6883 Chills (without fever): Secondary | ICD-10-CM

## 2013-11-09 DIAGNOSIS — J111 Influenza due to unidentified influenza virus with other respiratory manifestations: Secondary | ICD-10-CM

## 2013-11-09 MED ORDER — OSELTAMIVIR PHOSPHATE 75 MG PO CAPS: 75.0000 mg | ORAL_CAPSULE | Freq: Two times a day (BID) | ORAL | Status: DC

## 2013-11-09 NOTE — Telephone Encounter (Signed)
Please offer her appt to be seen this afternoon. Also, please pull her paper chart.  She is allergic to cephalosporins and sulfa, and has recently been on zpaks, biaxin, levaquin.  I would like to see if she has tolerate PCN's (ie Augmentin) in the past--none prescribed per computer.

## 2013-11-09 NOTE — Progress Notes (Signed)
Chief Complaint  Patient presents with  . Cough    has worsened, moved to her chest. Also has had fever, aches and chills. Mucus is brown to yellow in color.    Patient was seen 12/11 with sinusitis.  She was put on Avelox.  She reports that her sinuses got a little better, less stuffy, was able to sleep better at night.  2 days ago she started coughing more (didn't have cough prior), then developed fever and chills 2 nights ago, T101.  Having ongoing pain in her cheeks, sinuses are now stopped up again.  Denies sore throat, but is having PND.  She has been using albuterol inhaler some, mostly at night, with good effect. +myalgias  No known sick contacts.  Requip has been helpful for her restless leg symptoms.  She is getting benefit from just the one tablet at bedtime.  Past Medical History  Diagnosis Date  . Asthma   . Allergy   . Diverticulosis   . RLS (restless legs syndrome)   . Chronic sinusitis    Past Surgical History  Procedure Laterality Date  . Nasal polyp surgery    . Colonoscopy      Dr. Elnoria Howard   History   Social History  . Marital Status: Married    Spouse Name: N/A    Number of Children: N/A  . Years of Education: N/A   Occupational History  . Principal at [employer]    Social History Main Topics  . Smoking status: Never Smoker   . Smokeless tobacco: Never Used  . Alcohol Use: Yes     Comment: 1 glass of wine twice a year.  . Drug Use: No  . Sexual Activity: Not Currently   Other Topics Concern  . Not on file   Social History Narrative  . No narrative on file   Current outpatient prescriptions:albuterol (PROAIR HFA) 108 (90 BASE) MCG/ACT inhaler, Inhale 2 puffs into the lungs every 6 (six) hours as needed., Disp: 18 g, Rfl: 0;  dextromethorphan-guaiFENesin (MUCINEX DM) 30-600 MG per 12 hr tablet, Take 1 tablet by mouth 2 (two) times daily., Disp: , Rfl: ;  fluticasone (FLONASE) 50 MCG/ACT nasal spray, PLACE TWO SPRAYS INTO THE NOSE DAILY., Disp: 16  g, Rfl: 10 meloxicam (MOBIC) 15 MG tablet, TAKE ONE TABLET BY MOUTH DAILY., Disp: 30 tablet, Rfl: 1;  mometasone-formoterol (DULERA) 100-5 MCG/ACT AERO, Inhale 2 puffs into the lungs 2 (two) times daily., Disp: , Rfl: ;  montelukast (SINGULAIR) 10 MG tablet, Take 1 tablet (10 mg total) by mouth at bedtime., Disp: 30 tablet, Rfl: 5;  moxifloxacin (AVELOX) 400 MG tablet, Take 1 tablet (400 mg total) by mouth daily., Disp: 14 tablet, Rfl: 0 phenylephrine (SUDAFED PE) 10 MG TABS tablet, Take 10 mg by mouth every 4 (four) hours as needed., Disp: , Rfl: ;  rOPINIRole (REQUIP) 0.25 MG tablet, Take 1 tablet (0.25 mg total) by mouth at bedtime., Disp: 30 tablet, Rfl: 5 Current facility-administered medications:methylPREDNISolone sodium succinate (SOLU-MEDROL) 130 mg in sodium chloride 0.9 % 50 mL IVPB, 130 mg, Intramuscular, Once, Ronnald Nian, MD  Allergies  Allergen Reactions  . Cephalosporins     hives  . Sulfa Antibiotics Hives   Paper chart reviewed--she has tolerated Augmentin in the past many times without reactions.   ROS:  +fevers, chills.  Denies rashes, bleeding/bruising.  + cough.  Some wheezing, with need for albuterol. Denies nausea, vomiting, diarrhea, chest pain, dizziness or other concerns. See HPI  PHYSICAL  EXAM: BP 130/84  Pulse 72  Temp(Src) 99.2 F (37.3 C) (Oral)  Ht 5\' 3"  (1.6 m)  Wt 160 lb (72.576 kg)  BMI 28.35 kg/m2 Mildly ill appearing female, with occasional cough, in no acute distress HEENT:  PERRL, EOMI, conjunctiva clear.  TM's and EAC's normal.  OP clear, moist mucus membranes. Nasal mucosa is mildly edematous, no erythema, clear mucus present. +tender over maxillary sinuses bilaterally. Neck: no lymphadenopathy or mass Heart: regular rate and rhythm without murmur Lungs: occasional ronchi/wheeze--cleared after coughing.  Phlegm was fairly clear/white Skin: no rashes  Influenza A +  ASSESSMENT/PLAN:  Influenza A - Plan: oseltamivir (TAMIFLU) 75 MG  capsule  Fever, unspecified - Plan: POC Influenza A&B (Binax test)  Chills - Plan: POC Influenza A&B (Binax test)  Body aches - Plan: POC Influenza A&B (Binax test)   Sinusitis--clinically improving.  Complete course of avelox Drink plenty of fluids Use tylenol and/or ibuprofen as needed for pain and fever control Use guaifenesin (mucinex) as needed for cough/thick phlegm Continue albuterol as needed  Tamiflu --take twice daily for 5 days.

## 2013-11-09 NOTE — Patient Instructions (Signed)
Sinusitis--clinically improving.  Complete course of avelox Drink plenty of fluids Use tylenol and/or ibuprofen as needed for pain and fever control Use guaifenesin (mucinex) as needed for cough/thick phlegm Continue albuterol as needed  Tamiflu --take twice daily for 5 days.  Influenza, Adult Influenza ("the flu") is a viral infection of the respiratory tract. It occurs more often in winter months because people spend more time in close contact with one another. Influenza can make you feel very sick. Influenza easily spreads from person to person (contagious). CAUSES  Influenza is caused by a virus that infects the respiratory tract. You can catch the virus by breathing in droplets from an infected person's cough or sneeze. You can also catch the virus by touching something that was recently contaminated with the virus and then touching your mouth, nose, or eyes. SYMPTOMS  Symptoms typically last 4 to 10 days and may include:  Fever.  Chills.  Headache, body aches, and muscle aches.  Sore throat.  Chest discomfort and cough.  Poor appetite.  Weakness or feeling tired.  Dizziness.  Nausea or vomiting. DIAGNOSIS  Diagnosis of influenza is often made based on your history and a physical exam. A nose or throat swab test can be done to confirm the diagnosis. RISKS AND COMPLICATIONS You may be at risk for a more severe case of influenza if you smoke cigarettes, have diabetes, have chronic heart disease (such as heart failure) or lung disease (such as asthma), or if you have a weakened immune system. Elderly people and pregnant women are also at risk for more serious infections. The most common complication of influenza is a lung infection (pneumonia). Sometimes, this complication can require emergency medical care and may be life-threatening. PREVENTION  An annual influenza vaccination (flu shot) is the best way to avoid getting influenza. An annual flu shot is now routinely  recommended for all adults in the [place] TREATMENT  In mild cases, influenza goes away on its own. Treatment is directed at relieving symptoms. For more severe cases, your caregiver may prescribe antiviral medicines to shorten the sickness. Antibiotic medicines are not effective, because the infection is caused by a virus, not by bacteria. HOME CARE INSTRUCTIONS  Only take over-the-counter or prescription medicines for pain, discomfort, or fever as directed by your caregiver.  Use a cool mist humidifier to make breathing easier.  Get plenty of rest until your temperature returns to normal. This usually takes 3 to 4 days.  Drink enough fluids to keep your urine clear or pale yellow.  Cover your mouth and nose when coughing or sneezing, and wash your hands well to avoid spreading the virus.  Stay home from work or school until your fever has been gone for at least 1 full day. SEEK MEDICAL CARE IF:   You have chest pain or a deep cough that worsens or produces more mucus.  You have nausea, vomiting, or diarrhea. SEEK IMMEDIATE MEDICAL CARE IF:   You have difficulty breathing, shortness of breath, or your skin or nails turn bluish.  You have severe neck pain or stiffness.  You have a severe headache, facial pain, or earache.  You have a worsening or recurring fever.  You have nausea or vomiting that cannot be controlled. MAKE SURE YOU:  Understand these instructions.  Will watch your condition.  Will get help right away if you are not doing well or get worse. Document Released: 11/02/2000 Document Revised: 05/06/2012 Document Reviewed: 02/04/2012 [facility] Patient Information 2014 [place], [place].

## 2013-11-09 NOTE — Telephone Encounter (Signed)
Pt states she is worse and it has now gone to her chest. She has 3 pills left. Send something to [facility]. Call if any questions [phone]

## 2013-11-23 ENCOUNTER — Telehealth: Payer: Self-pay | Admitting: Family Medicine

## 2013-11-23 ENCOUNTER — Telehealth: Payer: Self-pay | Admitting: *Deleted

## 2013-11-23 ENCOUNTER — Other Ambulatory Visit: Payer: Self-pay | Admitting: *Deleted

## 2013-11-23 MED ORDER — AMOXICILLIN-POT CLAVULANATE 875-125 MG PO TABS: 1.0000 | ORAL_TABLET | Freq: Two times a day (BID) | ORAL | Status: DC

## 2013-11-23 NOTE — Telephone Encounter (Signed)
Per discussion with Dr. Susann GivensLalonde, ok to call in Augmentin for her.  875mg  BID x 10 days. If she isn't getting better, she will need to schedule an OV (with him).  She has documented cephalosporin allergy, but per old chart that was reviewed at last visit, she has tolerated Augmentin without allergic reaction

## 2013-11-23 NOTE — Telephone Encounter (Signed)
Patient advised.

## 2013-11-30 ENCOUNTER — Telehealth: Payer: Self-pay | Admitting: Internal Medicine

## 2013-11-30 MED ORDER — MELOXICAM 15 MG PO TABS: ORAL_TABLET | ORAL | Status: DC

## 2013-11-30 NOTE — Telephone Encounter (Signed)
Refill request for meloxicam 15mg  to [facility] [address]

## 2013-12-03 ENCOUNTER — Encounter: Payer: Self-pay | Admitting: Family Medicine

## 2013-12-16 ENCOUNTER — Telehealth: Payer: Self-pay | Admitting: Internal Medicine

## 2013-12-16 DIAGNOSIS — G2581 Restless legs syndrome: Secondary | ICD-10-CM

## 2013-12-16 NOTE — Telephone Encounter (Signed)
Per my last visit with pt (in December), she stated that her RLS was improved with just 1 tablet at bedtime.  I'm going to leave it up to you as to whether you feel comfortable changing her rx to higher dose (was not recommended to increase; had been doing well on lower dose at last visit).  She needs to stick with one provider, and it sounds like you are her true PCP.

## 2013-12-16 NOTE — Telephone Encounter (Signed)
Pharmacy sent a fax over stating that pt says she take 2hs of requip 0.25mg . Please advise and send into pharmacy

## 2013-12-17 MED ORDER — ROPINIROLE HCL 0.5 MG PO TABS: 0.5000 mg | ORAL_TABLET | Freq: Three times a day (TID) | ORAL | Status: DC

## 2013-12-17 NOTE — Telephone Encounter (Signed)
She has had to go to 0.5 mg of Requip to get relief from her RLS. I will call that new prescription in.

## 2014-02-11 ENCOUNTER — Ambulatory Visit (INDEPENDENT_AMBULATORY_CARE_PROVIDER_SITE_OTHER): Payer: BC Managed Care – PPO | Admitting: Family Medicine

## 2014-02-11 ENCOUNTER — Other Ambulatory Visit: Payer: Self-pay | Admitting: Family Medicine

## 2014-02-11 ENCOUNTER — Encounter: Payer: Self-pay | Admitting: Family Medicine

## 2014-02-11 VITALS — BP 150/90 | HR 68 | Wt 162.0 lb

## 2014-02-11 DIAGNOSIS — J45901 Unspecified asthma with (acute) exacerbation: Secondary | ICD-10-CM

## 2014-02-11 DIAGNOSIS — J0191 Acute recurrent sinusitis, unspecified: Secondary | ICD-10-CM

## 2014-02-11 DIAGNOSIS — J329 Chronic sinusitis, unspecified: Secondary | ICD-10-CM

## 2014-02-11 DIAGNOSIS — J019 Acute sinusitis, unspecified: Secondary | ICD-10-CM

## 2014-02-11 MED ORDER — MOXIFLOXACIN HCL 400 MG PO TABS: 400.0000 mg | ORAL_TABLET | Freq: Every day | ORAL | Status: DC

## 2014-02-11 MED ORDER — PREDNISONE 10 MG PO KIT: PACK | ORAL | Status: DC

## 2014-02-11 NOTE — Progress Notes (Signed)
   Subjective:    Patient ID: Kayla Henry, female    DOB: 02/10/1956, 58 y.o.   MRN: [id number]  HPI She has a 2-1/2 week history of cough, sore throat, wheezing, nasal congestion with postnasal drainage. The cough is productive. Presently she is on Dulera, Singulair, Flonase, OTC decongestants. Review of record indicates she has had intermittent difficulty with this. She was seen for evaluation of underlying allergy issues and apparently nothing significant showed up. She's had previous CT scan which did show chronic changes. She states that Avelox tends to work the best. She has also been given steroids in the past.  Review of Systems     Objective:   Physical Exam alert and in no distress. Tympanic membranes and canals are normal. Throat is clear. Tonsils are normal. Neck is supple without adenopathy or thyromegaly. Cardiac exam shows a regular sinus rhythm without murmurs or gallops. Lungs are clear to auscultation. Nasal mucosa is slightly red. Tender over maxillary sinuses.        Assessment & Plan:  Recurrent acute sinusitis - Plan: moxifloxacin (AVELOX) 400 MG tablet  Sinusitis, chronic  Asthma with acute exacerbation  tinea on present medications. Also given a Sterapred dose pack she is to refill the Avelox if not entirely better at the end of the two-week course. Also recheck here in one month. We'll probably order another CT scan and refer to ENT less she makes significant progress. Recommended nighttime use of Afrin nasal spray to help with breathing through her nose.

## 2014-02-11 NOTE — Patient Instructions (Signed)
Take the antibiotic and steroids. You can use Afrin nasal spray at night. If you're not totally back to normal at the first 2 weeks of the Avelox take the second

## 2014-04-12 ENCOUNTER — Other Ambulatory Visit: Payer: Self-pay | Admitting: Family Medicine

## 2014-04-27 ENCOUNTER — Ambulatory Visit (INDEPENDENT_AMBULATORY_CARE_PROVIDER_SITE_OTHER): Payer: BC Managed Care – PPO | Admitting: Family Medicine

## 2014-04-27 ENCOUNTER — Encounter: Payer: Self-pay | Admitting: Family Medicine

## 2014-04-27 VITALS — BP 120/84 | HR 60 | Wt 161.0 lb

## 2014-04-27 DIAGNOSIS — J0191 Acute recurrent sinusitis, unspecified: Secondary | ICD-10-CM

## 2014-04-27 DIAGNOSIS — J019 Acute sinusitis, unspecified: Secondary | ICD-10-CM

## 2014-04-27 DIAGNOSIS — J45909 Unspecified asthma, uncomplicated: Secondary | ICD-10-CM

## 2014-04-27 MED ORDER — MOXIFLOXACIN HCL 400 MG PO TABS: 400.0000 mg | ORAL_TABLET | Freq: Every day | ORAL | Status: DC

## 2014-04-27 NOTE — Progress Notes (Signed)
   Subjective:    Patient ID: Kayla Henry, female    DOB: 05/27/1956, 58 y.o.   MRN: [id number]  HPI He has a two-week history of nasal congestion, loss of smell, dry cough that has now become productive. Also purulent PND. She is having some slight difficulty with wheezing but has been much worse in the past. School will and next Tuesday.  Review of Systems     Objective:   Physical Exam alert and in no distress. Tympanic membranes and canals are normal. Throat is clear. Tonsils are normal. Neck is supple without adenopathy or thyromegaly. Cardiac exam shows a regular sinus rhythm without murmurs or gallops. Lungs are clear to auscultation. Nasal mucosa is slightly red with tenderness over maxillary sinuses.        Assessment & Plan:  Recurrent acute sinusitis - Plan: moxifloxacin (AVELOX) 400 MG tablet  Intrinsic asthma  she will continue on her present medications. Avelox was prescribed. She was given a refill and will get it refilled if her symptoms are not entirely better.

## 2014-05-07 ENCOUNTER — Telehealth: Payer: Self-pay

## 2014-05-07 NOTE — Telephone Encounter (Signed)
Pt called stating that the antibiotic that you gave her is not working and she would like to know if you can call in something else for her. She is still having sinus issues. Please advise

## 2014-05-09 MED ORDER — AMOXICILLIN-POT CLAVULANATE 875-125 MG PO TABS: 1.0000 | ORAL_TABLET | Freq: Two times a day (BID) | ORAL | Status: DC

## 2014-05-09 NOTE — Telephone Encounter (Signed)
I called in an antibiotic, have her call us in and let us know how she is doing

## 2014-05-10 NOTE — Telephone Encounter (Signed)
Pt.notified

## 2014-07-05 ENCOUNTER — Other Ambulatory Visit: Payer: Self-pay | Admitting: Family Medicine

## 2014-07-06 NOTE — Telephone Encounter (Signed)
DR.LALONDE IS THIS OKAY 

## 2014-07-12 ENCOUNTER — Telehealth: Payer: Self-pay | Admitting: Family Medicine

## 2014-07-12 MED ORDER — BUDESONIDE-FORMOTEROL FUMARATE 160-4.5 MCG/ACT IN AERO: 2.0000 | INHALATION_SPRAY | Freq: Two times a day (BID) | RESPIRATORY_TRACT | Status: DC

## 2014-07-12 NOTE — Telephone Encounter (Signed)
She would like Symbicort instead of Dulera. Apparently Symbicort works better

## 2014-07-20 ENCOUNTER — Ambulatory Visit (INDEPENDENT_AMBULATORY_CARE_PROVIDER_SITE_OTHER): Payer: BC Managed Care – PPO | Admitting: Family Medicine

## 2014-07-20 ENCOUNTER — Encounter: Payer: Self-pay | Admitting: Family Medicine

## 2014-07-20 ENCOUNTER — Telehealth: Payer: Self-pay | Admitting: Internal Medicine

## 2014-07-20 VITALS — BP 140/82 | HR 68 | Wt 159.0 lb

## 2014-07-20 DIAGNOSIS — J3089 Other allergic rhinitis: Secondary | ICD-10-CM

## 2014-07-20 DIAGNOSIS — J45909 Unspecified asthma, uncomplicated: Secondary | ICD-10-CM

## 2014-07-20 DIAGNOSIS — J0100 Acute maxillary sinusitis, unspecified: Secondary | ICD-10-CM

## 2014-07-20 DIAGNOSIS — Z7189 Other specified counseling: Secondary | ICD-10-CM

## 2014-07-20 DIAGNOSIS — J302 Other seasonal allergic rhinitis: Secondary | ICD-10-CM

## 2014-07-20 DIAGNOSIS — Z719 Counseling, unspecified: Secondary | ICD-10-CM

## 2014-07-20 DIAGNOSIS — J0101 Acute recurrent maxillary sinusitis: Secondary | ICD-10-CM

## 2014-07-20 MED ORDER — MOXIFLOXACIN HCL 400 MG PO TABS: 400.0000 mg | ORAL_TABLET | Freq: Every day | ORAL | Status: DC

## 2014-07-20 MED ORDER — METHYLPREDNISOLONE SODIUM SUCC 125 MG IJ SOLR
125.0000 mg | Freq: Once | INTRAMUSCULAR | Status: AC
  Administered 2014-07-20: 125 mg via INTRAMUSCULAR

## 2014-07-20 MED ORDER — MONTELUKAST SODIUM 10 MG PO TABS: 10.0000 mg | ORAL_TABLET | Freq: Every day | ORAL | Status: DC

## 2014-07-20 MED ORDER — PREDNISONE (PAK) 10 MG PO TABS: ORAL_TABLET | ORAL | Status: DC

## 2014-07-20 NOTE — Telephone Encounter (Signed)
Pt wants to know does she need to continue to take her singular, if so then please send to pharmacy

## 2014-07-20 NOTE — Addendum Note (Signed)
Addended by: Herminio Commons A on: 07/20/2014 02:19 PM   Modules accepted: Orders

## 2014-07-20 NOTE — Telephone Encounter (Signed)
Tell her I called it in

## 2014-07-20 NOTE — Telephone Encounter (Signed)
Sent to pharmacy already

## 2014-07-20 NOTE — Progress Notes (Signed)
   Subjective:    Patient ID: Kayla Henry, female    DOB: 02-23-1956, 58 y.o.   MRN: [id number]  HPI She started 2 weeks ago with coughing followed by nasal congestion with rhinorrhea or and left maxillary sinus pressure purulent PND. She is now having a productive cough 3 she continues on her allergy and asthma medication. She has had at least 3 of these episodes of this year. She has stopped taking her Singulair. Also her father died yesterday.   Review of Systems     Objective:   Physical Exam alert and in no distress. Tympanic membranes and canals are normal. Throat is clear. Tonsils are normal. Nasal mucosa is slightly red with tenderness over left maxillary sinus. Neck is supple without adenopathy or thyromegaly. Cardiac exam shows a regular sinus rhythm without murmurs or gallops. Lungs are clear to auscultation.        Assessment & Plan:  Acute recurrent maxillary sinusitis - Plan: moxifloxacin (AVELOX) 400 MG tablet, predniSONE (STERAPRED UNI-PAK) 10 MG tablet, methylPREDNISolone sodium succinate (SOLU-MEDROL) 125 mg/2 mL injection 125 mg  Bereavement counseling  Intrinsic asthma, unspecified asthma severity, uncomplicated  Other seasonal allergic rhinitis  encouraged her to continue on her asthma medicine as well as nasal steroid, Singulair and use an antihistamine of choice. I will also give her a Sterapred dose pack as well as a shot. Hopefully this will quiet her down. She has had difficulties with steroids in the past so I will give her a short course. Also discussed bereavement with her. She seems to have a good attitude towards this.

## 2014-07-20 NOTE — Addendum Note (Signed)
Addended by: Ronnald Nian on: 07/20/2014 03:48 PM   Modules accepted: Orders

## 2014-09-14 ENCOUNTER — Encounter: Payer: Self-pay | Admitting: Family Medicine

## 2014-09-14 ENCOUNTER — Ambulatory Visit (INDEPENDENT_AMBULATORY_CARE_PROVIDER_SITE_OTHER): Payer: BC Managed Care – PPO | Admitting: Family Medicine

## 2014-09-14 VITALS — BP 124/80 | HR 70 | Temp 98.3°F | Wt 164.0 lb

## 2014-09-14 DIAGNOSIS — J0101 Acute recurrent maxillary sinusitis: Secondary | ICD-10-CM

## 2014-09-14 DIAGNOSIS — J45909 Unspecified asthma, uncomplicated: Secondary | ICD-10-CM

## 2014-09-14 DIAGNOSIS — M1990 Unspecified osteoarthritis, unspecified site: Secondary | ICD-10-CM | POA: Insufficient documentation

## 2014-09-14 DIAGNOSIS — Z23 Encounter for immunization: Secondary | ICD-10-CM

## 2014-09-14 DIAGNOSIS — J3089 Other allergic rhinitis: Secondary | ICD-10-CM

## 2014-09-14 MED ORDER — MELOXICAM 15 MG PO TABS: ORAL_TABLET | ORAL | Status: DC

## 2014-09-14 MED ORDER — MOXIFLOXACIN HCL 400 MG PO TABS: 400.0000 mg | ORAL_TABLET | Freq: Every day | ORAL | Status: DC

## 2014-09-14 MED ORDER — FLUTICASONE PROPIONATE 50 MCG/ACT NA SUSP: NASAL | Status: DC

## 2014-09-14 NOTE — Progress Notes (Signed)
   Subjective:    Patient ID: Kayla AmbleShelia J Henry, female    DOB: 1956/02/27, 58 y.o.   MRN: [id number]  HPI Approximately 2 weeks ago she started having difficulty again with nasal congestion, PND, sinus pressure, cough that has become productive. She is also noted decreased hearing but no fever, chills or sweats. She has had previous sinus surgery. A CT scan one year ago was reviewed. On her last visit she was given steroids as well as an antibiotic. She continues on Symbicort and Flonase. She would like a refill on her Mobic as well as Flonase. 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 She has seen podiatry for this.   Review of Systems     Objective:   Physical Exam alert and in no distress. Tympanic membranes and canals are normal. Throat is clear. Tonsils are normal. Neck is supple without adenopathy or thyromegaly. Nasal mucosa is slightly red with tenderness over maxillary sinuses Cardiac exam shows a regular sinus rhythm without murmurs or gallops. Lungs show scattered wheezing        Assessment & Plan:  Acute recurrent maxillary sinusitis - Plan: moxifloxacin (AVELOX) 400 MG tablet, CT MAXILLOFACIAL LTD WO CM, Ambulatory referral to ENT  Intrinsic asthma, unspecified asthma severity, uncomplicated  Other allergic rhinitis - Plan: fluticasone (FLONASE) 50 MCG/ACT nasal spray  Need for prophylactic vaccination and inoculation against influenza - Plan: Flu Vaccine QUAD 36+ mos IM  Arthritis - Plan: meloxicam (MOBIC) 15 MG tablet  I think it is time to revisit her sinuses and get her into see her nose and throat to see if there is anything else that can be done.

## 2014-09-16 ENCOUNTER — Other Ambulatory Visit: Payer: Self-pay

## 2014-09-16 ENCOUNTER — Telehealth: Payer: Self-pay | Admitting: Family Medicine

## 2014-09-16 DIAGNOSIS — J0101 Acute recurrent maxillary sinusitis: Secondary | ICD-10-CM

## 2014-09-16 MED ORDER — PREDNISONE (PAK) 10 MG PO TABS: ORAL_TABLET | ORAL | Status: DC

## 2014-09-16 NOTE — Telephone Encounter (Signed)
Call in the 6 day Sterapred pack

## 2014-09-16 NOTE — Telephone Encounter (Signed)
Sent in ds pack per [employer]

## 2014-09-16 NOTE — Telephone Encounter (Signed)
Pt called and states having trouble breathing and [facility] [address]  Her inhaler is not working for this.  Pt ph [phone]

## 2014-09-17 ENCOUNTER — Ambulatory Visit
Admission: RE | Admit: 2014-09-17 | Discharge: 2014-09-17 | Disposition: A | Discharge status: Home / Self Care | Payer: BC Managed Care – PPO | Source: Ambulatory Visit | Attending: Family Medicine | Admitting: Family Medicine

## 2014-09-17 DIAGNOSIS — J0101 Acute recurrent maxillary sinusitis: Secondary | ICD-10-CM

## 2014-09-22 ENCOUNTER — Other Ambulatory Visit: Payer: Self-pay | Admitting: Otolaryngology

## 2014-09-22 DIAGNOSIS — R519 Headache, unspecified: Secondary | ICD-10-CM

## 2014-09-22 DIAGNOSIS — R51 Headache: Secondary | ICD-10-CM

## 2014-09-22 DIAGNOSIS — R0981 Nasal congestion: Secondary | ICD-10-CM

## 2014-09-30 ENCOUNTER — Ambulatory Visit
Admission: RE | Admit: 2014-09-30 | Discharge: 2014-09-30 | Disposition: A | Discharge status: Home / Self Care | Payer: BC Managed Care – PPO | Source: Ambulatory Visit | Attending: Otolaryngology | Admitting: Otolaryngology

## 2014-09-30 DIAGNOSIS — R0981 Nasal congestion: Secondary | ICD-10-CM

## 2014-09-30 DIAGNOSIS — R519 Headache, unspecified: Secondary | ICD-10-CM

## 2014-09-30 DIAGNOSIS — R51 Headache: Secondary | ICD-10-CM

## 2014-10-07 ENCOUNTER — Other Ambulatory Visit: Payer: Self-pay | Admitting: Otolaryngology

## 2015-01-10 ENCOUNTER — Other Ambulatory Visit: Payer: Self-pay

## 2015-01-10 ENCOUNTER — Ambulatory Visit (INDEPENDENT_AMBULATORY_CARE_PROVIDER_SITE_OTHER): Payer: BC Managed Care – PPO | Admitting: Family Medicine

## 2015-01-10 ENCOUNTER — Encounter: Payer: Self-pay | Admitting: Family Medicine

## 2015-01-10 VITALS — BP 128/80 | HR 78 | Temp 97.9°F | Wt 165.0 lb

## 2015-01-10 DIAGNOSIS — G2581 Restless legs syndrome: Secondary | ICD-10-CM

## 2015-01-10 DIAGNOSIS — J0101 Acute recurrent maxillary sinusitis: Secondary | ICD-10-CM

## 2015-01-10 DIAGNOSIS — J3089 Other allergic rhinitis: Secondary | ICD-10-CM

## 2015-01-10 DIAGNOSIS — J4540 Moderate persistent asthma, uncomplicated: Secondary | ICD-10-CM

## 2015-01-10 DIAGNOSIS — J45909 Unspecified asthma, uncomplicated: Secondary | ICD-10-CM

## 2015-01-10 DIAGNOSIS — M1990 Unspecified osteoarthritis, unspecified site: Secondary | ICD-10-CM

## 2015-01-10 MED ORDER — ROPINIROLE HCL 0.5 MG PO TABS: 0.5000 mg | ORAL_TABLET | Freq: Three times a day (TID) | ORAL | Status: DC

## 2015-01-10 MED ORDER — MOXIFLOXACIN HCL 400 MG PO TABS: 400.0000 mg | ORAL_TABLET | Freq: Every day | ORAL | Status: DC

## 2015-01-10 MED ORDER — PREDNISONE (PAK) 10 MG PO TABS: ORAL_TABLET | ORAL | Status: DC

## 2015-01-10 MED ORDER — MONTELUKAST SODIUM 10 MG PO TABS: 10.0000 mg | ORAL_TABLET | Freq: Every day | ORAL | Status: DC

## 2015-01-10 MED ORDER — BUDESONIDE-FORMOTEROL FUMARATE 160-4.5 MCG/ACT IN AERO: 2.0000 | INHALATION_SPRAY | Freq: Two times a day (BID) | RESPIRATORY_TRACT | Status: DC

## 2015-01-10 MED ORDER — FLUTICASONE PROPIONATE 50 MCG/ACT NA SUSP: NASAL | Status: DC

## 2015-01-10 MED ORDER — ALBUTEROL SULFATE (2.5 MG/3ML) 0.083% IN NEBU
2.5000 mg | INHALATION_SOLUTION | Freq: Once | RESPIRATORY_TRACT | Status: AC
  Administered 2015-01-10: 2.5 mg via RESPIRATORY_TRACT

## 2015-01-10 MED ORDER — ROPINIROLE HCL 1 MG PO TABS: 1.0000 mg | ORAL_TABLET | Freq: Three times a day (TID) | ORAL | Status: DC

## 2015-01-10 MED ORDER — METHYLPREDNISOLONE SODIUM SUCC 125 MG IJ SOLR
125.0000 mg | Freq: Once | INTRAMUSCULAR | Status: AC
  Administered 2015-01-10: 125 mg via INTRAVENOUS

## 2015-01-10 MED ORDER — MELOXICAM 15 MG PO TABS: ORAL_TABLET | ORAL | Status: DC

## 2015-01-10 NOTE — Progress Notes (Signed)
   Subjective:    Patient ID: Kayla Henry, female    DOB: 21-Sep-1956, 59 y.o.   MRN: [id number]  HPI She complains of a three-week history of nasal congestion, PND and now dry cough that has become productive cough as well as sinus pressure. She did have sinus surgery last November. That dictation is not in the record. No fever or chills. She continues on her asthma and allergy medications and has had use her rescue inhaler quite regularly. In the past she apparently had been on Singulair but is no longer on this. She also states that she is still having difficulty with sleep related restless legs. The Requip is apparently not working.  Review of Systems     Objective:   Physical Exam Alert and in no distress. Tympanic membranes and canals are normal. Pharyngeal area is normal. Neck is supple without adenopathy or thyromegaly. Cardiac exam shows a regular sinus rhythm without murmurs or gallops. Lungs are clear to auscultation. Tender to palpation over frontal and maxillary sinuses Peak flow 310 prior to treatment and 315 after. Another nebulizer treatment was offered however she declined. She would like a steroid shot and dose pack.       Assessment & Plan:  Intrinsic asthma, unspecified asthma severity, uncomplicated - Plan: montelukast (SINGULAIR) 10 MG tablet, albuterol (PROVENTIL) (2.5 MG/3ML) 0.083% nebulizer solution 2.5 mg, PR INHAL RX, AIRWAY OBST/DX SPUTUM INDUCT, PR INHAL RX, AIRWAY OBST/DX SPUTUM INDUCT  Other allergic rhinitis - Plan: montelukast (SINGULAIR) 10 MG tablet  Asthmatic bronchitis, moderate persistent, uncomplicated - Plan: moxifloxacin (AVELOX) 400 MG tablet, albuterol (PROVENTIL) (2.5 MG/3ML) 0.083% nebulizer solution 2.5 mg, PR INHAL RX, AIRWAY OBST/DX SPUTUM INDUCT, PR INHAL RX, AIRWAY OBST/DX SPUTUM INDUCT  Acute recurrent maxillary sinusitis - Plan: methylPREDNISolone sodium succinate (SOLU-MEDROL) 125 mg/2 mL injection 125 mg, predniSONE (STERAPRED  UNI-PAK) 10 MG tablet  RLS (restless legs syndrome) - Plan: rOPINIRole (REQUIP) 1 MG tablet  she will call and let me know how she is doing in a week or so. I will add Singulair to her regimen to see if this will help. Also instructed her to call me after roughly 10 days with her next URI for an evaluation. She is obviously very susceptible to getting sinus infections. I will also increase her weekly up to 1 mg per day. She will let me know how she is doing with sleep as well as with her breathing.

## 2015-01-17 ENCOUNTER — Telehealth: Payer: Self-pay | Admitting: Family Medicine

## 2015-01-17 DIAGNOSIS — J4540 Moderate persistent asthma, uncomplicated: Secondary | ICD-10-CM

## 2015-01-17 MED ORDER — MOXIFLOXACIN HCL 400 MG PO TABS: 400.0000 mg | ORAL_TABLET | Freq: Every day | ORAL | Status: DC

## 2015-01-17 NOTE — Telephone Encounter (Signed)
Pt called and states she is 50% better and [facility] [address]

## 2015-01-25 ENCOUNTER — Telehealth: Payer: Self-pay | Admitting: Family Medicine

## 2015-01-25 DIAGNOSIS — J4540 Moderate persistent asthma, uncomplicated: Secondary | ICD-10-CM

## 2015-01-25 MED ORDER — MOXIFLOXACIN HCL 400 MG PO TABS: 400.0000 mg | ORAL_TABLET | Freq: Every day | ORAL | Status: DC

## 2015-01-25 NOTE — Telephone Encounter (Signed)
Pt called and stated that she was still having symptoms. She is requesting another round of medication. [facility] uses [facility] [address].

## 2015-07-21 ENCOUNTER — Encounter (HOSPITAL_COMMUNITY): Payer: Self-pay | Admitting: Anesthesiology

## 2015-07-21 ENCOUNTER — Inpatient Hospital Stay (HOSPITAL_COMMUNITY)
Admission: EM | Admit: 2015-07-21 | Discharge: 2015-07-28 | DRG: 915 | Disposition: A | Discharge status: Other Acute Inpatient Hospital | Payer: BC Managed Care – PPO | Attending: Pulmonary Disease | Admitting: Pulmonary Disease

## 2015-07-21 ENCOUNTER — Encounter (HOSPITAL_COMMUNITY): Payer: Self-pay | Admitting: *Deleted

## 2015-07-21 ENCOUNTER — Emergency Department (HOSPITAL_COMMUNITY): Payer: BC Managed Care – PPO

## 2015-07-21 DIAGNOSIS — F419 Anxiety disorder, unspecified: Secondary | ICD-10-CM | POA: Diagnosis present

## 2015-07-21 DIAGNOSIS — I10 Essential (primary) hypertension: Secondary | ICD-10-CM | POA: Diagnosis not present

## 2015-07-21 DIAGNOSIS — T783XXA Angioneurotic edema, initial encounter: Secondary | ICD-10-CM | POA: Diagnosis present

## 2015-07-21 DIAGNOSIS — R739 Hyperglycemia, unspecified: Secondary | ICD-10-CM | POA: Diagnosis present

## 2015-07-21 DIAGNOSIS — R1310 Dysphagia, unspecified: Secondary | ICD-10-CM | POA: Diagnosis not present

## 2015-07-21 DIAGNOSIS — R0600 Dyspnea, unspecified: Secondary | ICD-10-CM | POA: Diagnosis present

## 2015-07-21 DIAGNOSIS — K5790 Diverticulosis of intestine, part unspecified, without perforation or abscess without bleeding: Secondary | ICD-10-CM | POA: Diagnosis present

## 2015-07-21 DIAGNOSIS — J45909 Unspecified asthma, uncomplicated: Secondary | ICD-10-CM | POA: Diagnosis present

## 2015-07-21 DIAGNOSIS — Z9911 Dependence on respirator [ventilator] status: Secondary | ICD-10-CM | POA: Diagnosis not present

## 2015-07-21 DIAGNOSIS — D649 Anemia, unspecified: Secondary | ICD-10-CM | POA: Diagnosis not present

## 2015-07-21 DIAGNOSIS — J9600 Acute respiratory failure, unspecified whether with hypoxia or hypercapnia: Secondary | ICD-10-CM | POA: Insufficient documentation

## 2015-07-21 DIAGNOSIS — J189 Pneumonia, unspecified organism: Secondary | ICD-10-CM | POA: Diagnosis not present

## 2015-07-21 DIAGNOSIS — Z0189 Encounter for other specified special examinations: Secondary | ICD-10-CM

## 2015-07-21 DIAGNOSIS — J9601 Acute respiratory failure with hypoxia: Secondary | ICD-10-CM | POA: Diagnosis not present

## 2015-07-21 DIAGNOSIS — J15212 Pneumonia due to Methicillin resistant Staphylococcus aureus: Secondary | ICD-10-CM | POA: Diagnosis not present

## 2015-07-21 DIAGNOSIS — J9620 Acute and chronic respiratory failure, unspecified whether with hypoxia or hypercapnia: Secondary | ICD-10-CM | POA: Diagnosis not present

## 2015-07-21 DIAGNOSIS — J1520 Pneumonia due to staphylococcus, unspecified: Secondary | ICD-10-CM | POA: Diagnosis not present

## 2015-07-21 DIAGNOSIS — G2581 Restless legs syndrome: Secondary | ICD-10-CM | POA: Diagnosis present

## 2015-07-21 DIAGNOSIS — T380X5A Adverse effect of glucocorticoids and synthetic analogues, initial encounter: Secondary | ICD-10-CM | POA: Diagnosis not present

## 2015-07-21 DIAGNOSIS — J329 Chronic sinusitis, unspecified: Secondary | ICD-10-CM | POA: Diagnosis not present

## 2015-07-21 DIAGNOSIS — Y95 Nosocomial condition: Secondary | ICD-10-CM | POA: Diagnosis present

## 2015-07-21 DIAGNOSIS — J988 Other specified respiratory disorders: Secondary | ICD-10-CM | POA: Insufficient documentation

## 2015-07-21 DIAGNOSIS — J90 Pleural effusion, not elsewhere classified: Secondary | ICD-10-CM

## 2015-07-21 DIAGNOSIS — Z889 Allergy status to unspecified drugs, medicaments and biological substances status: Secondary | ICD-10-CM

## 2015-07-21 LAB — BASIC METABOLIC PANEL
Anion gap: 6 (ref 5–15)
BUN: 19 mg/dL (ref 6–20)
CO2: 27 mmol/L (ref 22–32)
CREATININE: 0.86 mg/dL (ref 0.44–1.00)
Calcium: 9.3 mg/dL (ref 8.9–10.3)
Chloride: 107 mmol/L (ref 101–111)
GFR calc Af Amer: >60 mL/min (ref >60)
GFR calc non Af Amer: >60 mL/min (ref >60)
GLUCOSE: 98 mg/dL (ref 65–99)
POTASSIUM: 4 mmol/L (ref 3.5–5.1)
SODIUM: 140 mmol/L (ref 135–145)

## 2015-07-21 LAB — GLUCOSE, CAPILLARY
GLUCOSE-CAPILLARY: 113 mg/dL — AB (ref 65–99)
Glucose-Capillary: 89 mg/dL (ref 65–99)
Glucose-Capillary: 148 mg/dL — ABNORMAL HIGH (ref 65–99)

## 2015-07-21 LAB — MRSA PCR SCREENING: MRSA by PCR: POSITIVE — AB

## 2015-07-21 LAB — C-REACTIVE PROTEIN: CRP: 0.5 mg/dL (ref <1.0)

## 2015-07-21 LAB — BLOOD GAS, ARTERIAL
Acid-base deficit: 3.4 mmol/L — ABNORMAL HIGH (ref 0.0–2.0)
Bicarbonate: 22.6 mEq/L (ref 20.0–24.0)
DRAWN BY: 232811
FIO2: 1
LHR: 14 {breaths}/min
MECHVT: 420 mL
O2 Saturation: 98.8 %
PCO2 ART: 45.9 mmHg — AB (ref 35.0–45.0)
PEEP: 5 cmH2O
PO2 ART: 372 mmHg — AB (ref 80.0–100.0)
Patient temperature: 97.6
TCO2: 21.1 mmol/L (ref 0–100)
pH, Arterial: 7.309 — ABNORMAL LOW (ref 7.350–7.450)

## 2015-07-21 LAB — CBC WITH DIFFERENTIAL/PLATELET
Basophils Absolute: 0 10*3/uL (ref 0.0–0.1)
Basophils Relative: 1 % (ref 0–1)
EOS ABS: 0.4 10*3/uL (ref 0.0–0.7)
EOS PCT: 5 % (ref 0–5)
HCT: 37.3 % (ref 36.0–46.0)
Hemoglobin: 11.9 g/dL — ABNORMAL LOW (ref 12.0–15.0)
LYMPHS ABS: 4 10*3/uL (ref 0.7–4.0)
LYMPHS PCT: 53 % — AB (ref 12–46)
MCH: 28.1 pg (ref 26.0–34.0)
MCHC: 31.9 g/dL (ref 30.0–36.0)
MCV: 88 fL (ref 78.0–100.0)
MONOS PCT: 7 % (ref 3–12)
Monocytes Absolute: 0.5 10*3/uL (ref 0.1–1.0)
Neutro Abs: 2.6 10*3/uL (ref 1.7–7.7)
Neutrophils Relative %: 34 % — ABNORMAL LOW (ref 43–77)
Platelets: 223 10*3/uL (ref 150–400)
RBC: 4.24 MIL/uL (ref 3.87–5.11)
RDW: 14.7 % (ref 11.5–15.5)
WBC: 7.5 10*3/uL (ref 4.0–10.5)

## 2015-07-21 LAB — PHOSPHORUS: PHOSPHORUS: 4.3 mg/dL (ref 2.5–4.6)

## 2015-07-21 LAB — SEDIMENTATION RATE: Sed Rate: 23 mm/hr — ABNORMAL HIGH (ref 0–22)

## 2015-07-21 LAB — MAGNESIUM: Magnesium: 2.1 mg/dL (ref 1.7–2.4)

## 2015-07-21 LAB — AMYLASE: AMYLASE: 172 U/L — AB (ref 28–100)

## 2015-07-21 MED ORDER — ROCURONIUM BROMIDE 50 MG/5ML IV SOLN
INTRAVENOUS | Status: AC
  Filled 2015-07-21: qty 2

## 2015-07-21 MED ORDER — PROPOFOL 1000 MG/100ML IV EMUL
INTRAVENOUS | Status: AC
  Administered 2015-07-21: 15 ug/kg/min via INTRAVENOUS
  Filled 2015-07-21: qty 100

## 2015-07-21 MED ORDER — BUDESONIDE 0.5 MG/2ML IN SUSP
0.5000 mg | Freq: Two times a day (BID) | RESPIRATORY_TRACT | Status: DC
  Administered 2015-07-21 – 2015-07-22 (×3): 0.5 mg via RESPIRATORY_TRACT
  Filled 2015-07-21 (×3): qty 2

## 2015-07-21 MED ORDER — CHLORHEXIDINE GLUCONATE 0.12% ORAL RINSE (MEDLINE KIT): 15.0000 mL | Freq: Two times a day (BID) | OROMUCOSAL | Status: DC

## 2015-07-21 MED ORDER — FAMOTIDINE IN NACL 20-0.9 MG/50ML-% IV SOLN
20.0000 mg | Freq: Once | INTRAVENOUS | Status: AC
  Administered 2015-07-21: 20 mg via INTRAVENOUS
  Filled 2015-07-21: qty 50

## 2015-07-21 MED ORDER — CHLORHEXIDINE GLUCONATE CLOTH 2 % EX PADS
6.0000 | MEDICATED_PAD | Freq: Every day | CUTANEOUS | Status: AC
  Administered 2015-07-22 – 2015-07-26 (×5): 6 via TOPICAL

## 2015-07-21 MED ORDER — CHLORHEXIDINE GLUCONATE 0.12% ORAL RINSE (MEDLINE KIT)
15.0000 mL | Freq: Two times a day (BID) | OROMUCOSAL | Status: DC
  Administered 2015-07-21 – 2015-07-28 (×14): 15 mL via OROMUCOSAL

## 2015-07-21 MED ORDER — METHYLPREDNISOLONE SODIUM SUCC 125 MG IJ SOLR
60.0000 mg | Freq: Four times a day (QID) | INTRAMUSCULAR | Status: DC
  Administered 2015-07-21 – 2015-07-24 (×13): 60 mg via INTRAVENOUS
  Filled 2015-07-21 (×13): qty 2

## 2015-07-21 MED ORDER — MIDAZOLAM HCL 2 MG/2ML IJ SOLN
2.0000 mg | INTRAMUSCULAR | Status: DC | PRN
  Administered 2015-07-21: 2 mg via INTRAVENOUS
  Filled 2015-07-21 (×2): qty 2

## 2015-07-21 MED ORDER — DEXTROSE IN LACTATED RINGERS 5 % IV SOLN
INTRAVENOUS | Status: DC
  Administered 2015-07-21 – 2015-07-25 (×6): via INTRAVENOUS

## 2015-07-21 MED ORDER — SODIUM CHLORIDE 0.9 % IV BOLUS (SEPSIS)
500.0000 mL | Freq: Once | INTRAVENOUS | Status: AC
  Administered 2015-07-21: 500 mL via INTRAVENOUS

## 2015-07-21 MED ORDER — FENTANYL CITRATE (PF) 100 MCG/2ML IJ SOLN
100.0000 ug | INTRAMUSCULAR | Status: DC | PRN
  Administered 2015-07-21: 100 ug via INTRAVENOUS
  Administered 2015-07-21 (×2): 50 ug via INTRAVENOUS
  Filled 2015-07-21 (×2): qty 2

## 2015-07-21 MED ORDER — ARFORMOTEROL TARTRATE 15 MCG/2ML IN NEBU
15.0000 ug | INHALATION_SOLUTION | Freq: Two times a day (BID) | RESPIRATORY_TRACT | Status: DC
  Administered 2015-07-21 – 2015-07-22 (×3): 15 ug via RESPIRATORY_TRACT
  Filled 2015-07-21 (×6): qty 2

## 2015-07-21 MED ORDER — PROPOFOL 1000 MG/100ML IV EMUL
5.0000 ug/kg/min | INTRAVENOUS | Status: DC
  Administered 2015-07-21: 40 ug/kg/min via INTRAVENOUS
  Administered 2015-07-21: 30 ug/kg/min via INTRAVENOUS
  Administered 2015-07-21: 15 ug/kg/min via INTRAVENOUS
  Administered 2015-07-22 (×3): 40 ug/kg/min via INTRAVENOUS
  Administered 2015-07-23: 30 ug/kg/min via INTRAVENOUS
  Administered 2015-07-23 (×2): 40 ug/kg/min via INTRAVENOUS
  Administered 2015-07-24 (×2): 35 ug/kg/min via INTRAVENOUS
  Administered 2015-07-24: 25 ug/kg/min via INTRAVENOUS
  Administered 2015-07-24: 30 ug/kg/min via INTRAVENOUS
  Administered 2015-07-25: 35 ug/kg/min via INTRAVENOUS
  Administered 2015-07-25: 40 ug/kg/min via INTRAVENOUS
  Administered 2015-07-25: 45 ug/kg/min via INTRAVENOUS
  Administered 2015-07-25: 40 ug/kg/min via INTRAVENOUS
  Administered 2015-07-26: 45 ug/kg/min via INTRAVENOUS
  Administered 2015-07-26 (×2): 40 ug/kg/min via INTRAVENOUS
  Administered 2015-07-26: 30 ug/kg/min via INTRAVENOUS
  Administered 2015-07-26: 35 ug/kg/min via INTRAVENOUS
  Administered 2015-07-27: 20 ug/kg/min via INTRAVENOUS
  Administered 2015-07-27 – 2015-07-28 (×2): 40 ug/kg/min via INTRAVENOUS
  Administered 2015-07-28: 25 ug/kg/min via INTRAVENOUS
  Filled 2015-07-21 (×31): qty 100

## 2015-07-21 MED ORDER — LIDOCAINE HCL (CARDIAC) 20 MG/ML IV SOLN
INTRAVENOUS | Status: AC
  Filled 2015-07-21: qty 5

## 2015-07-21 MED ORDER — FENTANYL CITRATE (PF) 100 MCG/2ML IJ SOLN
50.0000 ug | INTRAMUSCULAR | Status: DC | PRN
  Administered 2015-07-22 (×2): 75 ug via INTRAVENOUS
  Administered 2015-07-22: 50 ug via INTRAVENOUS
  Administered 2015-07-22 – 2015-07-23 (×2): 75 ug via INTRAVENOUS
  Administered 2015-07-23: 50 ug via INTRAVENOUS
  Administered 2015-07-23: 75 ug via INTRAVENOUS
  Administered 2015-07-23: 50 ug via INTRAVENOUS
  Administered 2015-07-24: 25 ug via INTRAVENOUS
  Administered 2015-07-24 – 2015-07-26 (×7): 50 ug via INTRAVENOUS
  Administered 2015-07-26 – 2015-07-27 (×2): 100 ug via INTRAVENOUS
  Administered 2015-07-27 – 2015-07-28 (×3): 50 ug via INTRAVENOUS
  Filled 2015-07-21 (×21): qty 2

## 2015-07-21 MED ORDER — DIPHENHYDRAMINE HCL 50 MG/ML IJ SOLN
50.0000 mg | Freq: Four times a day (QID) | INTRAMUSCULAR | Status: DC
  Administered 2015-07-21 – 2015-07-28 (×30): 50 mg via INTRAVENOUS
  Filled 2015-07-21 (×30): qty 1

## 2015-07-21 MED ORDER — MONTELUKAST SODIUM 10 MG PO TABS
10.0000 mg | ORAL_TABLET | Freq: Every day | ORAL | Status: DC
  Administered 2015-07-21 – 2015-07-27 (×7): 10 mg
  Filled 2015-07-21 (×8): qty 1

## 2015-07-21 MED ORDER — ANTISEPTIC ORAL RINSE SOLUTION (CORINZ)
7.0000 mL | Freq: Four times a day (QID) | OROMUCOSAL | Status: DC
  Administered 2015-07-21: 7 mL via OROMUCOSAL

## 2015-07-21 MED ORDER — METHYLPREDNISOLONE SODIUM SUCC 125 MG IJ SOLR
125.0000 mg | Freq: Once | INTRAMUSCULAR | Status: AC
Start: 2015-07-21 — End: 2015-07-21
  Administered 2015-07-21: 125 mg via INTRAVENOUS
  Filled 2015-07-21: qty 2

## 2015-07-21 MED ORDER — ANTISEPTIC ORAL RINSE SOLUTION (CORINZ)
7.0000 mL | Freq: Four times a day (QID) | OROMUCOSAL | Status: DC
  Administered 2015-07-22 – 2015-07-28 (×26): 7 mL via OROMUCOSAL

## 2015-07-21 MED ORDER — MUPIROCIN 2 % EX OINT
1.0000 "application " | TOPICAL_OINTMENT | Freq: Two times a day (BID) | CUTANEOUS | Status: AC
  Administered 2015-07-21 – 2015-07-26 (×10): 1 via NASAL
  Filled 2015-07-21 (×2): qty 22

## 2015-07-21 MED ORDER — DIPHENHYDRAMINE HCL 50 MG/ML IJ SOLN
50.0000 mg | Freq: Once | INTRAMUSCULAR | Status: AC
  Administered 2015-07-21: 50 mg via INTRAVENOUS
  Filled 2015-07-21: qty 1

## 2015-07-21 MED ORDER — FAMOTIDINE IN NACL 20-0.9 MG/50ML-% IV SOLN
20.0000 mg | Freq: Two times a day (BID) | INTRAVENOUS | Status: DC
  Administered 2015-07-21 – 2015-07-28 (×15): 20 mg via INTRAVENOUS
  Filled 2015-07-21 (×15): qty 50

## 2015-07-21 MED ORDER — ANTISEPTIC ORAL RINSE SOLUTION (CORINZ): 7.0000 mL | Freq: Four times a day (QID) | OROMUCOSAL | Status: DC

## 2015-07-21 MED ORDER — HEPARIN SODIUM (PORCINE) 5000 UNIT/ML IJ SOLN
5000.0000 [IU] | Freq: Three times a day (TID) | INTRAMUSCULAR | Status: DC
  Administered 2015-07-21 – 2015-07-27 (×18): 5000 [IU] via SUBCUTANEOUS
  Filled 2015-07-21 (×21): qty 1

## 2015-07-21 MED ORDER — MIDAZOLAM HCL 2 MG/2ML IJ SOLN
2.0000 mg | INTRAMUSCULAR | Status: DC | PRN
  Administered 2015-07-21: 2 mg via INTRAVENOUS

## 2015-07-21 MED ORDER — SUCCINYLCHOLINE CHLORIDE 20 MG/ML IJ SOLN
INTRAMUSCULAR | Status: AC
  Administered 2015-07-21: 100 mg
  Filled 2015-07-21: qty 1

## 2015-07-21 MED ORDER — FENTANYL CITRATE (PF) 100 MCG/2ML IJ SOLN
100.0000 ug | INTRAMUSCULAR | Status: AC | PRN
  Administered 2015-07-21 – 2015-07-26 (×3): 100 ug via INTRAVENOUS
  Filled 2015-07-21 (×4): qty 2

## 2015-07-21 MED ORDER — ETOMIDATE 2 MG/ML IV SOLN
INTRAVENOUS | Status: AC
  Administered 2015-07-21: 40 mg
  Filled 2015-07-21: qty 20

## 2015-07-21 NOTE — ED Notes (Signed)
Propofol drip increased to 40

## 2015-07-21 NOTE — Progress Notes (Signed)
Initial Nutrition Assessment  DOCUMENTATION CODES:   Not applicable  INTERVENTION:  - If pt unable to be extubated within 24 hours, recommend Vital High Protein @ 40 mL/hr which will provide, with kcal from Propofol, 1425 kcal, 84 grams protein (95% minimal protein needs), and 802 mL free water. - RD will continue to monitor for needs  NUTRITION DIAGNOSIS:   Inadequate oral intake related to inability to eat as evidenced by NPO status.  GOAL:   Patient will meet greater than or equal to 90% of their needs  MONITOR:   Vent status, Weight trends, Labs, I & O's  REASON FOR ASSESSMENT:   Consult Assessment of nutrition requirement/status (TF recommendations)  ASSESSMENT:   59 year old female w/ known h/o asthma and allergies. 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 She woke up her husband and they proceeded to the ER. By the time 40 minutes had gone by the swelling had progressed to the point she was having muffled speech and difficulty w/ saliva. She was electively intubated for airway protection.  Pt seen for consult. BMI indicates overweight status. Patient is currently intubated on ventilator support MV: 6.4 L/min Temp (24hrs), Avg:98.7 F (37.1 C), Min:97.6 F (36.4 C), Max:99.5 F (37.5 C)  Propofol: 17.6 ml/hr (465 kcal).  Pt's husband at bedside and reports that pt had a good appetite PTA with no recent changes and that weight has been stable.   OGT in place and TF recommendations outlined above. Not able to meet needs at this time. No muscle or fat wasting noted. Medications reviewed. Labs reviewed.    Diet Order:  Diet NPO time specified  Skin:  Reviewed, no issues  Last BM:  PTA  Height:   Ht Readings from Last 1 Encounters:  07/21/15  (1.6 m)    Weight:   Wt Readings from Last 1 Encounters:  07/21/15 162 lb (73.483 kg)    Ideal Body  Weight:  52.27 kg (kg)  BMI:  Body mass index is 28.7 kg/(m^2).  Estimated Nutritional Needs:   Kcal:  1471  Protein:  88-110 grams  Fluid:  1.8-2.2 L/day  EDUCATION NEEDS:   No education needs identified at this time     Trenton Gammon, RD, LDN Inpatient Clinical Dietitian Pager # [phone] After hours/weekend pager # [phone]

## 2015-07-21 NOTE — ED Notes (Signed)
Pt states that she woke up approx 30 min ago and her tongue was itching; pt states that it has progressed to her tongue swelling and feeling short of breath due to the swelling; pt states that she took 2 Benadryl tablets prior to coming in but that it is not helping; pt states that she did not ingest or expose herself to anything unusual tonight and is not sure what she may be reacting to

## 2015-07-21 NOTE — ED Notes (Signed)
NT attempted to draw added admission labs, failed.

## 2015-07-21 NOTE — ED Provider Notes (Addendum)
CSN: [id number]     Arrival date & time 07/21/15  0401 History   First MD Initiated Contact with Patient 07/21/15 0407     Chief Complaint  Patient presents with  . Allergic Reaction     (Consider location/radiation/quality/duration/timing/severity/associated sxs/prior Treatment) HPI Patient presents with right-sided tongue swelling for the past 40 minutes. She states she's having no difficulty breathing. She took 2 Benadryl without any effect. Denies any recent medication or new food exposure. She is not on any Ace inhibitors. No previously similar symptoms. Denies rash, nausea or vomiting. Past Medical History  Diagnosis Date  . Asthma   . Allergy   . Diverticulosis   . RLS (restless legs syndrome)   . Chronic sinusitis    Past Surgical History  Procedure Laterality Date  . Nasal polyp surgery    . Colonoscopy      Dr. Elnoria Howard   No family history on file. Social History  Substance Use Topics  . Smoking status: Never Smoker   . Smokeless tobacco: Never Used  . Alcohol Use: Yes     Comment: 1 glass of wine twice a year.   OB History    No data available     Review of Systems  HENT: Positive for facial swelling and voice change.   Respiratory: Negative for shortness of breath, wheezing and stridor.   Gastrointestinal: Negative for nausea and vomiting.  Skin: Negative for rash.  All other systems reviewed and are negative.     Allergies  Cephalosporins and Sulfa antibiotics  Home Medications   Prior to Admission medications   Medication Sig Start Date End Date Taking? Authorizing Provider  budesonide-formoterol (SYMBICORT) 160-4.5 MCG/ACT inhaler Inhale 2 puffs into the lungs 2 (two) times daily. 01/10/15  Yes Ronnald Nian, MD  fluticasone [facility]) 50 MCG/ACT nasal spray USE TWO SPRAY IN EACH NOSTRIL EVERY DAY 01/10/15  Yes Ronnald Nian, MD  gabapentin (NEURONTIN) 100 MG capsule Take 100 mg by mouth 3 (three) times daily.   Yes Historical Provider, MD  meloxicam  (MOBIC) 15 MG tablet TAKE ONE TABLET BY MOUTH ONCE DAILY 01/10/15  Yes Ronnald Nian, MD  montelukast (SINGULAIR) 10 MG tablet Take 1 tablet (10 mg total) by mouth at bedtime. 01/10/15  Yes Ronnald Nian, MD  rOPINIRole (REQUIP) 1 MG tablet Take 1 tablet (1 mg total) by mouth 3 (three) times daily. 01/10/15  Yes Ronnald Nian, MD  moxifloxacin (AVELOX) 400 MG tablet Take 1 tablet (400 mg total) by mouth daily. Patient not taking: Reported on 07/21/2015 01/25/15   Ronnald Nian, MD  predniSONE (STERAPRED UNI-PAK) 10 MG tablet Take 6 tablets day 1, 5 tablets day 2, 4 tablets day 3, 3 tablets day 4, 2 tablets day 5 and then 1 tablet day 6 Patient not taking: Reported on 07/21/2015 01/10/15   Ronnald Nian, MD   BP 162/100 mmHg  Pulse 82  Temp(Src) 98.6 F (37 C) (Oral)  Resp 20  Ht 5\' 3"  (1.6 m)  Wt 162 lb (73.483 kg)  BMI 28.70 kg/m2  SpO2 100% Physical Exam  Constitutional: She is oriented to person, place, and time. She appears well-developed and well-nourished. No distress.  HENT:  Head: Normocephalic and atraumatic.  Mouth/Throat: Oropharynx is clear and moist.  Right-sided tongue swelling. Able to visualize the oropharynx with no swelling noted. No lip swelling  Eyes: EOM are normal. Pupils are equal, round, and reactive to light.  Neck: Normal range of motion. Neck supple.  Cardiovascular: Normal rate and regular rhythm.   Pulmonary/Chest: Effort normal and breath sounds normal. No stridor. No respiratory distress. She has no wheezes. She has no rales.  Abdominal: Soft. Bowel sounds are normal.  Musculoskeletal: Normal range of motion. She exhibits no edema or tenderness.  Neurological: She is alert and oriented to person, place, and time.  Skin: Skin is warm and dry. No rash noted. No erythema.  Psychiatric: She has a normal mood and affect. Her behavior is normal.  Nursing note and vitals reviewed.   ED Course  INTUBATION Date/Time: 07/21/2015 4:45 AM Performed by: Loren Racer Authorized by: Ranae Palms, Alonie Gazzola Consent: The procedure was performed in an emergent situation. Indications: airway protection Intubation method: video-assisted Sedatives: etomidate Paralytic: succinylcholine Laryngoscope size: Mac 4 Tube size: 7.5 mm Tube type: cuffed Number of attempts: 4 Ventilation between attempts: BVM Cricoid pressure: yes Cords visualized: yes Post-procedure assessment: chest rise and CO2 detector Breath sounds: equal Cuff inflated: yes ETT to lip: 23 cm Tube secured with: ETT holder Chest x-ray interpreted by me. Patient tolerance: Patient tolerated the procedure well with no immediate complications Comments: Considerable amount of edema around the vocal cords. CRNA attempted first to pass the ET tube and then again with bougie with no success. Small amount of trauma with some bleeding. I readjusted blade and then was able to pass bougie through the cords and then fed ET tube over.   (including critical care time) Labs Review Labs Reviewed  CBC WITH DIFFERENTIAL/PLATELET - Abnormal; Notable for the following:    Hemoglobin 11.9 (*)    Neutrophils Relative % 34 (*)    Lymphocytes Relative 53 (*)    All other components within normal limits  BASIC METABOLIC PANEL  BLOOD GAS, ARTERIAL    Imaging Review No results found. I have personally reviewed and evaluated these images and lab results as part of my medical decision-making.   EKG Interpretation None     CRITICAL CARE Performed by: Ranae Palms, Devaun Hernandez Total critical care time: 30 min Critical care time was exclusive of separately billable procedures and treating other patients. Critical care was necessary to treat or prevent imminent or life-threatening deterioration. Critical care was time spent personally by me on the following activities: development of treatment plan with patient and/or surrogate as well as nursing, discussions with consultants, evaluation of patient's response to  treatment, examination of patient, obtaining history from patient or surrogate, ordering and performing treatments and interventions, ordering and review of laboratory studies, ordering and review of radiographic studies, pulse oximetry and re-evaluation of patient's condition.  MDM   Final diagnoses:  Encounter for imaging study to confirm nasogastric (NG) tube placement  Angioedema, initial encounter  Compromised airway    Patient presenting with angioedema. Airway is intact at present. We'll closely monitor. Patient is aware if the swelling worsens then she'll need to be intubated to protect the airway.  0435: Pt with mild increase in tongue size. Post OP more difficult to visualize. No respiratory distress. Will consult anesthesia and proceed with intubation for airway protection.   1610: Patient intubated with some difficulty. Discussed with critical care and see patient in the emergency department and admit.   Loren Racer, MD 07/21/15 [phone]  ET tube 1 cm from the carina. RT instructed to withdrawal to 3 cm.  Loren Racer, MD 07/21/15 [phone]

## 2015-07-21 NOTE — H&P (Signed)
PULMONARY / CRITICAL CARE MEDICINE   Name: Kayla Henry MRN: [id number] DOB: [birth year]    ADMISSION DATE:  07/21/2015 CONSULTATION DATE:   9/1  REFERRING MD :  Lita Mains    CHIEF COMPLAINT:   Angioedema   INITIAL PRESENTATION:  59 year old female w/ h/o allergies and asthma admitted early am hours of 9/1 w/ angioedema w/ airway compromise; etiology unclear on presentation.    STUDIES:    SIGNIFICANT EVENTS:    HISTORY OF PRESENT ILLNESS:   This is a 59 year old female w/ known h/o asthma and allergies. She works as a Education officer, museum. Was in usual state of health until she was awoken around 0300 w/ the sensation that her tongue was swelling. She woke up her husband and they proceeded to the ER. By the time 40 minutes had gone by the swelling had progressed to the point she was having muffled speech and difficulty w/ saliva. She was electively intubated for airway protection. Prior to intubation and on discussing this w/ her husband she had not recently started any new medication, new supplement, or cooking recipe out of the ordinary. She had eaten at home that evening. She is not on an ace-I, had been in absolute usual state of health prior to going to bed. 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  PAST MEDICAL HISTORY :   has a past medical history of Asthma; Allergy; Diverticulosis; RLS (restless legs syndrome); and Chronic sinusitis.  has past surgical history that includes Nasal polyp surgery and Colonoscopy. Prior to Admission medications   Medication Sig Start Date End Date Taking? Authorizing Provider  budesonide-formoterol (SYMBICORT) 160-4.5 MCG/ACT inhaler Inhale 2 puffs into the lungs 2 (two) times daily. 01/10/15  Yes Denita Lung, MD  diphenhydrAMINE (SOMINEX) 25 MG tablet Take 50 mg by mouth once.   Yes Historical Provider, MD  fluticasone (FLONASE) 50  MCG/ACT nasal spray USE TWO SPRAY IN EACH NOSTRIL EVERY DAY Patient taking differently: Place 2 sprays into both nostrils daily as needed for allergies.  01/10/15  Yes Denita Lung, MD  gabapentin (NEURONTIN) 100 MG capsule Take 100 mg by mouth.    Yes Historical Provider, MD  ibuprofen (ADVIL,MOTRIN) 200 MG tablet Take 400 mg by mouth every 6 (six) hours as needed for headache, mild pain or moderate pain.   Yes Historical Provider, MD  meloxicam (MOBIC) 15 MG tablet TAKE ONE TABLET BY MOUTH ONCE DAILY Patient taking differently: Take 15 mg by mouth daily.  01/10/15  Yes Denita Lung, MD  montelukast (SINGULAIR) 10 MG tablet Take 1 tablet (10 mg total) by mouth at bedtime. 01/10/15  Yes Denita Lung, MD  rOPINIRole (REQUIP) 1 MG tablet Take 1 tablet (1 mg total) by mouth 3 (three) times daily. 01/10/15  Yes Denita Lung, MD  moxifloxacin (AVELOX) 400 MG tablet Take 1 tablet (400 mg total) by mouth daily. Patient not taking: Reported on 07/21/2015 01/25/15   Denita Lung, MD  predniSONE (STERAPRED UNI-PAK) 10 MG tablet Take 6 tablets day 1, 5 tablets day 2, 4 tablets day 3, 3 tablets day 4, 2 tablets day 5 and then 1 tablet day 6 Patient not taking: Reported on 07/21/2015 01/10/15   Denita Lung, MD   Allergies  Allergen Reactions  . Cephalosporins     hives  . Sulfa Antibiotics Hives  FAMILY HISTORY:  has no family status information on file.  SOCIAL HISTORY:  reports that she has never smoked. She has never used smokeless tobacco. She reports that she drinks alcohol. She reports that she does not use illicit drugs.  REVIEW OF SYSTEMS:  Unable   SUBJECTIVE:  Sedated on vent  VITAL SIGNS: Temp:  [97.6 F (36.4 C)-98.9 F (37.2 C)] 98.9 F (37.2 C) (09/01 0855) Pulse Rate:  [56-86] 65 (09/01 0855) Resp:  [14-23] 23 (09/01 0855) BP: (121-203)/(67-108) 126/67 mmHg (09/01 0855) SpO2:  [100 %] 100 % (09/01 0855) FiO2 (%):  [50 %-100 %] 50 % (09/01 0855) Weight:  [73.483 kg (162  lb)] 73.483 kg (162 lb) (09/01 0410) HEMODYNAMICS:   VENTILATOR SETTINGS: Vent Mode:  [-] PRVC FiO2 (%):  [50 %-100 %] 50 % Set Rate:  [14 bmp] 14 bmp Vt Set:  [420 mL] 420 mL PEEP:  [5 cmH20] 5 cmH20 Plateau Pressure:  [19 cmH20] 19 cmH20 INTAKE / OUTPUT: No intake or output data in the 24 hours ending 07/21/15 0903  PHYSICAL EXAMINATION: General:  Sedated 59 year old female intubated on full vent support Neuro:  Sedated. Nods appropriately denies discomfort  HEENT:  [place], orally intubated. Tongue is swollen and large. Protrudes out of mouth.  Cardiovascular:  rrr Lungs:  CTA Abdomen: soft, non-tender  Musculoskeletal:  Good tone  Skin:  Intact   LABS:  CBC  Recent Labs Lab 07/21/15 0421  WBC 7.5  HGB 11.9*  HCT 37.3  PLT 223   Coag's No results for input(s): APTT, INR in the last 168 hours. BMET  Recent Labs Lab 07/21/15 0421  NA 140  K 4.0  CL 107  CO2 27  BUN 19  CREATININE 0.86  GLUCOSE 98   Electrolytes  Recent Labs Lab 07/21/15 0421  CALCIUM 9.3   Sepsis Markers No results for input(s): LATICACIDVEN, PROCALCITON, O2SATVEN in the last 168 hours. ABG  Recent Labs Lab 07/21/15 0605  PHART 7.309*  PCO2ART 45.9*  PO2ART 372*   Liver Enzymes No results for input(s): AST, ALT, ALKPHOS, BILITOT, ALBUMIN in the last 168 hours. Cardiac Enzymes No results for input(s): TROPONINI, PROBNP in the last 168 hours. Glucose No results for input(s): GLUCAP in the last 168 hours.  Imaging Dg Abd 1 View  07/21/2015   CLINICAL DATA:  Nasogastric tube placement  EXAM: ABDOMEN - 1 VIEW  COMPARISON:  None.  FINDINGS: The nasogastric tube extends well into the stomach with tip in the region of the distal gastric body. Abdominal gas pattern is negative for obstruction or perforation. There is a generous volume of stool throughout the colon.  IMPRESSION: Nasogastric tube extends well into the stomach.   Electronically Signed   By: Andreas Newport M.D.   On:  07/21/2015 06:09   Dg Chest Portable 1 View  07/21/2015   CLINICAL DATA:  Respiratory failure.  EXAM: PORTABLE CHEST - 1 VIEW  COMPARISON:  05/19/2013  FINDINGS: The endotracheal tube is 2.6 cm above the carina. The nasogastric tube extends well into the stomach. There is no pneumothorax. The lungs are clear. Heart size is normal. No pneumothorax is evident.  IMPRESSION: Support equipment appears satisfactorily positioned.  The lungs are grossly clear.   Electronically Signed   By: Andreas Newport M.D.   On: 07/21/2015 06:10     ASSESSMENT / PLAN:  PULMONARY OETT 9/1 A: Angioedema VDRF d/t ineffective airway clearance  H/o asthma  P:   Full vent support F/u  abg and PCXR in am Add brovana/budesonide  H1 and H2 blockade Scheduled steroids Send CRP, ESR and C4 complement   CARDIOVASCULAR CVL A:  No acute  P:  Tele   RENAL A:   No acute  P:   Add MIVFs I&O F/u am chemistry   GASTROINTESTINAL A:   No acute  P:   Scheduled H2 blockade  Start nutritional support   HEMATOLOGIC A:   Mild anemia (normocytic/normochromic) P:  [place] heparin Trend CBC Transfuse per ICU guidelines   INFECTIOUS A:   No evidence of infection   P:   Trend cbc and fever curve   ENDOCRINE A:   No acute  P:   Trend glucose   NEUROLOGIC A:   Pain P:   RASS goal: -2 PAD protocol    FAMILY  - Updates:   - Inter-disciplinary family meet or Palliative Care meeting due by: 9/8    TODAY'S SUMMARY: angioedema; etiology unclear. Intubated for airway protection. Will treat w/ steroids and H2 blockade. Supportive care on vent until tongue swelling decreases & returns to normal. Concerning that there is no clear cause or contributing factor at this point.   Erick Colace ACNP-BC [place] Pager # [phone] OR # [phone] if no answer   07/21/2015, 9:03 AM

## 2015-07-21 NOTE — ED Notes (Signed)
Husband at bedside/patient pulling against safety restraints, Propofol increased to 30 mcg/kg/min

## 2015-07-21 NOTE — Progress Notes (Signed)
eLink Physician-Brief Progress Note Patient Name: Kayla Henry DOB: 1956-07-29 MRN: [id number]   Date of Service  07/21/2015  HPI/Events of Note    eICU Interventions  Fentanyl orders written to reflect prn pain     Intervention Category Intermediate Interventions: Pain - evaluation and management  MCQUAID, DOUGLAS 07/21/2015, 9:25 PM

## 2015-07-21 NOTE — ED Notes (Signed)
Critical care at bedside  

## 2015-07-21 NOTE — Progress Notes (Signed)
eLink Physician-Brief Progress Note Patient Name: Kayla Henry DOB: Oct 07, 1956 MRN: [id number]   Date of Service  07/21/2015  HPI/Events of Note  Pt intubated for angioedema.   eICU Interventions  Wrote sedation/vent orders.      Intervention Category Major Interventions: Airway management  Shane Crutch 07/21/2015, 5:10 AM

## 2015-07-22 ENCOUNTER — Inpatient Hospital Stay (HOSPITAL_COMMUNITY): Payer: BC Managed Care – PPO

## 2015-07-22 DIAGNOSIS — J9620 Acute and chronic respiratory failure, unspecified whether with hypoxia or hypercapnia: Secondary | ICD-10-CM

## 2015-07-22 LAB — CBC
HCT: 40.1 % (ref 36.0–46.0)
HEMOGLOBIN: 13.1 g/dL (ref 12.0–15.0)
MCH: 28.9 pg (ref 26.0–34.0)
MCHC: 32.7 g/dL (ref 30.0–36.0)
MCV: 88.3 fL (ref 78.0–100.0)
PLATELETS: 181 10*3/uL (ref 150–400)
RBC: 4.54 MIL/uL (ref 3.87–5.11)
RDW: 15.1 % (ref 11.5–15.5)
WBC: 12.4 10*3/uL — AB (ref 4.0–10.5)

## 2015-07-22 LAB — GLUCOSE, CAPILLARY
GLUCOSE-CAPILLARY: 104 mg/dL — AB (ref 65–99)
GLUCOSE-CAPILLARY: 127 mg/dL — AB (ref 65–99)
Glucose-Capillary: 132 mg/dL — ABNORMAL HIGH (ref 65–99)
Glucose-Capillary: 116 mg/dL — ABNORMAL HIGH (ref 65–99)
Glucose-Capillary: 126 mg/dL — ABNORMAL HIGH (ref 65–99)
Glucose-Capillary: 140 mg/dL — ABNORMAL HIGH (ref 65–99)

## 2015-07-22 LAB — BASIC METABOLIC PANEL
ANION GAP: 10 (ref 5–15)
BUN: 11 mg/dL (ref 6–20)
CO2: 22 mmol/L (ref 22–32)
Calcium: 8.8 mg/dL — ABNORMAL LOW (ref 8.9–10.3)
Chloride: 107 mmol/L (ref 101–111)
Creatinine, Ser: 0.88 mg/dL (ref 0.44–1.00)
Glucose, Bld: 146 mg/dL — ABNORMAL HIGH (ref 65–99)
POTASSIUM: 4.5 mmol/L (ref 3.5–5.1)
SODIUM: 139 mmol/L (ref 135–145)

## 2015-07-22 LAB — MAGNESIUM: MAGNESIUM: 2.1 mg/dL (ref 1.7–2.4)

## 2015-07-22 LAB — PHOSPHORUS: PHOSPHORUS: 3.7 mg/dL (ref 2.5–4.6)

## 2015-07-22 MED ORDER — VITAL HIGH PROTEIN PO LIQD
1000.0000 mL | ORAL | Status: DC
  Filled 2015-07-22: qty 1000

## 2015-07-22 MED ORDER — IPRATROPIUM-ALBUTEROL 0.5-2.5 (3) MG/3ML IN SOLN
3.0000 mL | RESPIRATORY_TRACT | Status: DC | PRN
  Administered 2015-07-23: 3 mL via RESPIRATORY_TRACT
  Filled 2015-07-22: qty 3

## 2015-07-22 MED ORDER — FREE WATER
100.0000 mL | Freq: Four times a day (QID) | Status: DC
  Administered 2015-07-22 – 2015-07-24 (×8): 100 mL

## 2015-07-22 MED ORDER — VITAL HIGH PROTEIN PO LIQD
1000.0000 mL | ORAL | Status: DC
  Administered 2015-07-22 – 2015-07-28 (×5): 1000 mL
  Filled 2015-07-22 (×8): qty 1000

## 2015-07-22 MED ORDER — VITAMINS A & D EX OINT
TOPICAL_OINTMENT | CUTANEOUS | Status: AC
  Administered 2015-07-22: 1
  Filled 2015-07-22: qty 10

## 2015-07-22 MED ORDER — LIP MEDEX EX OINT
TOPICAL_OINTMENT | CUTANEOUS | Status: DC | PRN
  Filled 2015-07-22: qty 7

## 2015-07-22 NOTE — Progress Notes (Signed)
eLink Physician-Brief Progress Note Patient Name: Kayla Henry DOB: Aug 05, 1956 MRN: [id number]   Date of Service  07/22/2015  HPI/Events of Note  Family requests discussion regarding case  eICU Interventions  Case discussed at length with family, stated that I agreed with current management. Explained that ordering labs at this point to assess for allergy or complement levels would not be useful considering steroids.     Intervention Category Minor Interventions: Routine modifications to care plan (e.g. PRN medications for pain, fever)  Deiona Hooper 07/22/2015, 7:22 PM

## 2015-07-22 NOTE — Progress Notes (Signed)
   07/22/15 1600  Clinical Encounter Type  Visited With Patient;Family;Patient and family together  Visit Type Initial;Spiritual support;Psychological support;Critical Care  Referral From Nurse;Other (Comment) (ED nurse)  Consult/Referral To Chaplain  Spiritual Encounters  Spiritual Needs Prayer;Emotional;Other (Comment) Veterinary surgeon)  Stress Factors  Patient Stress Factors Health changes  Family Stress Factors Health changes   Chaplain visited with the patient per referral from the ED nurse.  Patient's family were receptive to visit from the Chaplain and requested prayer for the patient.  Chaplain intervention included pastoral conversation, emotional support and prayer.  Chaplain will follow-up with the patient and her family at a later time.

## 2015-07-22 NOTE — Progress Notes (Addendum)
Nutrition Follow-up  DOCUMENTATION CODES:   Not applicable  INTERVENTION:  - Recommend: Vital High Protein @ 45 mL/hr. This regimen, along with kcal from Propfol, will provide 1545 kcal, 94.5 grams protein, and 903 mL free water. - RD will continue to monitor for needs  NUTRITION DIAGNOSIS:   Inadequate oral intake related to inability to eat as evidenced by NPO status. -ongoing  GOAL:   Patient will meet greater than or equal to 90% of their needs -unmet  MONITOR:   Vent status, Weight trends, Labs, I & O's  ASSESSMENT:   59 year old female w/ known h/o asthma and allergies. 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 She woke up her husband and they proceeded to the ER. By the time 40 minutes had gone by the swelling had progressed to the point she was having muffled speech and difficulty w/ saliva. She was electively intubated for airway protection.  9/2 Patient is currently intubated on ventilator support MV: 6.3 L/min Temp (24hrs), Avg:99 F (37.2 C), Min:97.3 F (36.3 C), Max:100.4 F (38 C)  Propofol: 17.6 ml/hr (465 kcal)  Needs have been re-estimated based on current status. TF recommendations outlined above; OGT remains in place and noted scant amount of drainage in wall canister.  Not meeting needs. Medications reviewed. Labs reviewed; CBGs: 89-148 mg/dL, Ca: 8.8 mg/dL.   9/1 - Patient is currently intubated on ventilator support - MV: 6.4 L/min - Propofol: 17.6 ml/hr (465 kcal). - Pt's husband at bedside and reports that pt had a good appetite PTA with no recent changes and that weight has been stable.  - OGT in place    Diet Order:  Diet NPO time specified  Skin:  Reviewed, no issues  Last BM:  PTA  Height:   Ht Readings from Last 1 Encounters:  07/21/15  (1.6 m)    Weight:   Wt Readings from Last 1 Encounters:  07/22/15 165 lb 5.5  oz (75 kg)    Ideal Body Weight:  52.27 kg (kg)  BMI:  Body mass index is 29.3 kg/(m^2).  Estimated Nutritional Needs:   Kcal:  1566  Protein:  90-113 grams  Fluid:  1.8-2.2 L/day  EDUCATION NEEDS:   No education needs identified at this time     Trenton Gammon, RD, LDN Inpatient Clinical Dietitian Pager # [phone] After hours/weekend pager # [phone]

## 2015-07-22 NOTE — Progress Notes (Signed)
PULMONARY / CRITICAL CARE MEDICINE   Name: Kayla Henry MRN: [id number] DOB: 1956-10-19    ADMISSION DATE:  07/21/2015 CONSULTATION DATE:   9/1  REFERRING MD :  Ranae Palms    CHIEF COMPLAINT:   Angioedema   INITIAL PRESENTATION:  59 year old female w/ h/o allergies and asthma admitted early am hours of 9/1 w/ angioedema w/ airway compromise; etiology unclear on presentation.    STUDIES:    SIGNIFICANT EVENTS:    HISTORY OF PRESENT ILLNESS:   This is a 59 year old female w/ known h/o asthma and allergies. 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 She woke up her husband and they proceeded to the ER. By the time 40 minutes had gone by the swelling had progressed to the point she was having muffled speech and difficulty w/ saliva. She was electively intubated for airway protection. Prior to intubation and on discussing this w/ her husband she had not recently started any new medication, new supplement, or cooking recipe out of the ordinary. She had eaten at home that evening. She is not on an ace-I, had been in absolute usual state of health prior to going to bed. 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   SUBJECTIVE:  Afebrile Tongue remains swollen Sedated on vent   VITAL SIGNS: Temp:  [97.3 F (36.3 C)-100.4 F (38 C)] 98.6 F (37 C) (09/02 0600) Pulse Rate:  [57-79] 73 (09/02 0600) Resp:  [14-23] 17 (09/02 0600) BP: (109-177)/(62-114) 161/87 mmHg (09/02 0600) SpO2:  [98 %-100 %] 100 % (09/02 0829) FiO2 (%):  [30 %-100 %] 30 % (09/02 0833) Weight:  [165 lb 5.5 oz (75 kg)] 165 lb 5.5 oz (75 kg) (09/02 0500) HEMODYNAMICS:   VENTILATOR SETTINGS: Vent Mode:  [-] PRVC FiO2 (%):  [30 %-100 %] 30 % Set Rate:  [14 bmp] 14 bmp Vt Set:  [420 mL] 420 mL PEEP:  [5  cmH20] 5 cmH20 Plateau Pressure:  [15 cmH20-19 cmH20] 15 cmH20 INTAKE / OUTPUT:  Intake/Output Summary (Last 24 hours) at 07/22/15 0943 Last data filed at 07/22/15 0600  Gross per 24 hour  Intake 1757.68 ml  Output   3315 ml  Net -1557.32 ml    PHYSICAL EXAMINATION: General:  Sedated 59 year old female intubated on full vent support Neuro:  Sedated. Nods appropriately denies discomfort  HEENT:  [place], orally intubated. Tongue is swollen and large. Protrudes out of mouth.  Cardiovascular:  rrr Lungs:  CTA Abdomen: soft, non-tender  Musculoskeletal:  Good tone  Skin:  Intact , no rash or bite marks  LABS:  CBC  Recent Labs Lab 07/21/15 0421 07/22/15 0415  WBC 7.5 12.4*  HGB 11.9* 13.1  HCT 37.3 40.1  PLT 223 181   Coag's No results for input(s): APTT, INR in the last 168 hours. BMET  Recent Labs Lab 07/21/15 0421 07/22/15 0415  NA 140 139  K 4.0 4.5  CL 107 107  CO2 27 22  BUN 19 11  CREATININE 0.86 0.88  GLUCOSE 98 146*   Electrolytes  Recent Labs Lab 07/21/15 0421 07/21/15 0435 07/22/15 0415  CALCIUM 9.3  --  8.8*  MG  --  2.1 2.1  PHOS  --  4.3 3.7   Sepsis Markers  No results for input(s): LATICACIDVEN, PROCALCITON, O2SATVEN in the last 168 hours. ABG  Recent Labs Lab 07/21/15 0605  PHART 7.309*  PCO2ART 45.9*  PO2ART 372*   Liver Enzymes No results for input(s): AST, ALT, ALKPHOS, BILITOT, ALBUMIN in the last 168 hours. Cardiac Enzymes No results for input(s): TROPONINI, PROBNP in the last 168 hours. Glucose  Recent Labs Lab 07/21/15 1139 07/21/15 1540 07/21/15 1953 07/21/15 2302 07/22/15 0454 07/22/15 0808  GLUCAP 89 113* 148* 132* 116* 126*    Imaging Dg Chest Port 1 View  07/22/2015   CLINICAL DATA:  Intubated patient, angioedema, history of asthma  EXAM: PORTABLE CHEST - 1 VIEW  COMPARISON:  Portable chest x-ray of July 21, 2015  FINDINGS: The lungs remain borderline hypoinflated. The interstitial markings are minimally  prominent though improved over yesterday's study. The heart and pulmonary vascularity are normal. The endotracheal tube tip lies 4 cm above the carina. The esophagogastric tube tip projects below the inferior margin of the image.  IMPRESSION: Further interval improvement in mild pulmonary interstitial edema. There is no alveolar pneumonia nor pleural effusion. The support tubes are in reasonable position.   Electronically Signed   By: David  [place] M.D.   On: 07/22/2015 07:34     ASSESSMENT / PLAN:  PULMONARY OETT 9/1 A: Angioedema VDRF d/t ineffective airway clearance  H/o asthma  P:   Full vent support dc brovana/budesonide - use duonebs q6h prn H1 and H2 blockade Scheduled steroids Monitor cuff leak daily - not present 9/2  CARDIOVASCULAR CVL -none A:  No acute  P:  Tele   RENAL A:   No acute  P:   Add MIVFs   GASTROINTESTINAL A:   No acute  P:   Scheduled H2 blockade  Start TFs  HEMATOLOGIC A:   Mild anemia (normocytic/normochromic) P:  [place] heparin Trend CBC Transfuse per ICU guidelines   INFECTIOUS A:   No evidence of infection   P:   Trend cbc and fever curve   ENDOCRINE A:   No acute  P:   Trend glucose   NEUROLOGIC A:   Pain P:   RASS goal: -2 PAD protocol  -propofol gtt, fent prn   FAMILY  - Updates: husband & sister at bedside -addressed all concerns  - Inter-disciplinary family meet or Palliative Care meeting due by: 9/8    TODAY'S SUMMARY: angioedema; etiology unclear. Intubated for airway protection. Will treat w/ steroids and H2 blockade. Supportive care on vent until tongue swelling decreases & returns to normal. Concerning that there is no clear cause or contributing factor at this point. Will need allergy eval eventually  The patient is critically ill with multiple organ systems failure and requires high complexity decision making for assessment and support, frequent evaluation and titration of therapies, application of  advanced monitoring technologies and extensive interpretation of multiple databases. Critical Care Time devoted to patient care services described in this note independent of APP time is 35 minutes.   Cyril Mourning MD. Tonny Bollman. [place] [facility] Pager [phone] If no response call [phone]       07/22/2015, 9:43 AM

## 2015-07-22 NOTE — Progress Notes (Signed)
Arterial puncture performed for morning ABG analysis, without success. Sister in the room asked for someone else to try and did not want the same person to try twice. Second RT attempted puncture without success. Sister refuses to allow another try at this time. MD notified. Order cancelled per MD telephone order. RN aware.

## 2015-07-22 NOTE — Progress Notes (Signed)
NUTRITION NOTE  New order for RD to manage TF received.   Will order Vital High Protein @ 45 mL/hr with 100 mL free water Q6h. This regimen, along with kcal from Propfol, will provide 1545 kcal, 94.5 grams protein, and 1303 mL free water.  Please see full RD note from 0932 this AM. RD will continue to monitor for needs.    Trenton Gammon, RD, LDN Inpatient Clinical Dietitian Pager # [phone] After hours/weekend pager # [phone]

## 2015-07-22 NOTE — Progress Notes (Signed)
eLink Physician-Brief Progress Note Patient Name: LEIANNA BARGA DOB: [birth year] MRN: [id number]   Date of Service  07/22/2015  HPI/Events of Note    eICU Interventions  Lip balm     Intervention Category Minor Interventions: Routine modifications to care plan (e.g. PRN medications for pain, fever)  Kamla Skilton 07/22/2015, 3:57 PM

## 2015-07-23 ENCOUNTER — Inpatient Hospital Stay (HOSPITAL_COMMUNITY): Payer: BC Managed Care – PPO

## 2015-07-23 DIAGNOSIS — J9600 Acute respiratory failure, unspecified whether with hypoxia or hypercapnia: Secondary | ICD-10-CM | POA: Insufficient documentation

## 2015-07-23 LAB — BASIC METABOLIC PANEL
Anion gap: 9 (ref 5–15)
BUN: 20 mg/dL (ref 6–20)
CHLORIDE: 109 mmol/L (ref 101–111)
CO2: 24 mmol/L (ref 22–32)
CREATININE: 0.8 mg/dL (ref 0.44–1.00)
Calcium: 8.5 mg/dL — ABNORMAL LOW (ref 8.9–10.3)
GFR calc Af Amer: >60 mL/min (ref >60)
GFR calc non Af Amer: >60 mL/min (ref >60)
GLUCOSE: 151 mg/dL — AB (ref 65–99)
POTASSIUM: 4.1 mmol/L (ref 3.5–5.1)
SODIUM: 142 mmol/L (ref 135–145)

## 2015-07-23 LAB — CBC
HEMATOCRIT: 33.7 % — AB (ref 36.0–46.0)
Hemoglobin: 10.7 g/dL — ABNORMAL LOW (ref 12.0–15.0)
MCH: 27.9 pg (ref 26.0–34.0)
MCHC: 31.8 g/dL (ref 30.0–36.0)
MCV: 88 fL (ref 78.0–100.0)
PLATELETS: 194 10*3/uL (ref 150–400)
RBC: 3.83 MIL/uL — ABNORMAL LOW (ref 3.87–5.11)
RDW: 15.4 % (ref 11.5–15.5)
WBC: 12.4 10*3/uL — AB (ref 4.0–10.5)

## 2015-07-23 LAB — C4 COMPLEMENT: COMPLEMENT C4, BODY FLUID: 36 mg/dL (ref 14–44)

## 2015-07-23 LAB — IRON AND TIBC
Iron: 84 ug/dL (ref 28–170)
SATURATION RATIOS: 27 % (ref 10.4–31.8)
TIBC: 309 ug/dL (ref 250–450)
UIBC: 225 ug/dL

## 2015-07-23 LAB — TRIGLYCERIDES: Triglycerides: 64 mg/dL (ref <150)

## 2015-07-23 LAB — GLUCOSE, CAPILLARY
GLUCOSE-CAPILLARY: 145 mg/dL — AB (ref 65–99)
GLUCOSE-CAPILLARY: 139 mg/dL — AB (ref 65–99)
GLUCOSE-CAPILLARY: 98 mg/dL (ref 65–99)
Glucose-Capillary: 124 mg/dL — ABNORMAL HIGH (ref 65–99)
Glucose-Capillary: 142 mg/dL — ABNORMAL HIGH (ref 65–99)
Glucose-Capillary: 149 mg/dL — ABNORMAL HIGH (ref 65–99)

## 2015-07-23 LAB — FERRITIN: FERRITIN: 47 ng/mL (ref 11–307)

## 2015-07-23 MED ORDER — CHLORHEXIDINE GLUCONATE 0.12 % MT SOLN
OROMUCOSAL | Status: AC
  Filled 2015-07-23: qty 15

## 2015-07-23 MED ORDER — HYDRALAZINE HCL 20 MG/ML IJ SOLN
10.0000 mg | Freq: Four times a day (QID) | INTRAMUSCULAR | Status: DC | PRN
  Administered 2015-07-24 – 2015-07-25 (×3): 10 mg via INTRAVENOUS
  Filled 2015-07-23 (×3): qty 1

## 2015-07-23 NOTE — Progress Notes (Signed)
PULMONARY / CRITICAL CARE MEDICINE   Name: Kayla Henry MRN: [id number] DOB: 12/08/1955    ADMISSION DATE:  07/21/2015 CONSULTATION DATE:   9/1  REFERRING MD :  Lita Mains    CHIEF COMPLAINT:   Angioedema   INITIAL PRESENTATION:  59 year old female w/ h/o allergies and asthma admitted early am hours of 9/1 w/ angioedema w/ airway compromise; etiology unclear on presentation.    STUDIES:   SIGNIFICANT EVENTS: 9/1 - intubated  SUBJECTIVE:  Awake, on vent Tongue remains swollen drooling  VITAL SIGNS: Temp:  [98.1 F (36.7 C)-100.4 F (38 C)] 99 F (37.2 C) (09/03 0611) Pulse Rate:  [53-66] 56 (09/03 0611) Resp:  [13-14] 14 (09/03 0611) BP: (107-186)/(49-91) 123/67 mmHg (09/03 0611) SpO2:  [96 %-100 %] 100 % (09/03 0611) FiO2 (%):  [30 %] 30 % (09/03 0437) Weight:  [79 kg (174 lb 2.6 oz)] 79 kg (174 lb 2.6 oz) (09/03 0500) HEMODYNAMICS:   VENTILATOR SETTINGS: Vent Mode:  [-] PRVC FiO2 (%):  [30 %] 30 % Set Rate:  [14 bmp] 14 bmp Vt Set:  [420 mL] 420 mL PEEP:  [5 cmH20] 5 cmH20 Plateau Pressure:  [12 cmH20-16 cmH20] 16 cmH20 INTAKE / OUTPUT:  Intake/Output Summary (Last 24 hours) at 07/23/15 0906 Last data filed at 07/23/15 0600  Gross per 24 hour  Intake 3173.77 ml  Output    945 ml  Net 2228.77 ml    PHYSICAL EXAMINATION: General: vent, calm Neuro:  Sedated. Fc, rass -1 HEENT:  [place], orally intubated. Tongue is swollen and large. Protrudes out of mouth, drool noted  Cardiovascular:  s1 s2 rrr Lungs:  Anterior ronchi Abdomen: soft, non-tender , no r/g, BS wnl Musculoskeletal:  Good tone  Skin:  Intact , no rash or bite marks  LABS:  CBC  Recent Labs Lab 07/21/15 0421 07/22/15 0415 07/23/15 0352  WBC 7.5 12.4* 12.4*  HGB 11.9* 13.1 10.7*  HCT 37.3 40.1 33.7*  PLT 223 181 194   Coag's No results for input(s): APTT, INR in the last 168 hours. BMET  Recent Labs Lab 07/21/15 0421 07/22/15 0415 07/23/15 0352  NA 140 139 142  K 4.0 4.5  4.1  CL 107 107 109  CO2 27 22 24   BUN 19 11 20   CREATININE 0.86 0.88 0.80  GLUCOSE 98 146* 151*   Electrolytes  Recent Labs Lab 07/21/15 0421 07/21/15 0435 07/22/15 0415 07/23/15 0352  CALCIUM 9.3  --  8.8* 8.5*  MG  --  2.1 2.1  --   PHOS  --  4.3 3.7  --    Sepsis Markers No results for input(s): LATICACIDVEN, PROCALCITON, O2SATVEN in the last 168 hours. ABG  Recent Labs Lab 07/21/15 0605  PHART 7.309*  PCO2ART 45.9*  PO2ART 372*   Liver Enzymes No results for input(s): AST, ALT, ALKPHOS, BILITOT, ALBUMIN in the last 168 hours. Cardiac Enzymes No results for input(s): TROPONINI, PROBNP in the last 168 hours. Glucose  Recent Labs Lab 07/22/15 1233 07/22/15 1546 07/22/15 1946 07/23/15 0013 07/23/15 0357 07/23/15 0809  GLUCAP 104* 127* 140* 142* 145* 124*    Imaging No results found.   ASSESSMENT / PLAN:  PULMONARY OETT 9/1 A: Angioedema VDRF d/t ineffective airway clearance  H/o asthma  P:   ABG reviewed, SBT today ok, no extubation given clinically not resolved tongue duonebs q6h prn H1 and H2 blockade- maintain scheduled Steroids to remain 60 q6h, then reduce likley in am  Will place trach surg airway  kit in room  Monitor cuff leak when weaning successful and tongue resolved Last ABG 7.31, 2 days ago, keep reasonable MV  CARDIOVASCULAR CVL -none A:  No acute  P:  Tele   RENAL A:   Normal renal FXN, on high dose steroids P:   kvo her BUN may rise on roids  GASTROINTESTINAL A:   Tolerating TF P:   Scheduled H2 blockade  TFs assess for BM  HEMATOLOGIC A:   Mild anemia (normocytic/normochromic)- now some dilution likely (pos 2 lite overnight)_ P:  [place] heparin Trend CBC Transfuse per ICU guidelines  Kvo, may need lasix  INFECTIOUS / allergy A:   No evidence of infection  Can have infective glossitis (unlikley) - unlear etiology P:   Trend cbc and fever curve  Send viral panel- for HSV, unlikely occur like this acute  but will send She DOES NOT need resp isolation, pcxr wnl Consider c1 q esterase levels (concern accuracy now) Consider iron levels Will need skin and rass testing for tree nuts  ENDOCRINE A:   On higher dose steroids P:   Trend glucose   NEUROLOGIC A:   Pain Vent dyschrony mild P:   RASS goal: -2 PAD protocol  -propofol gtt WUA Avoid self extubation, trach kit placed bedside   FAMILY  - Updates: husband & sister at bedside -addressed all concerns  - Inter-disciplinary family meet or Palliative Care meeting due by: 9/8  Ccm time 30 min   Lavon Paganini. Titus Mould, MD, [place] Pgr: [facility]

## 2015-07-24 DIAGNOSIS — R0600 Dyspnea, unspecified: Secondary | ICD-10-CM

## 2015-07-24 DIAGNOSIS — J988 Other specified respiratory disorders: Secondary | ICD-10-CM

## 2015-07-24 DIAGNOSIS — T783XXA Angioneurotic edema, initial encounter: Secondary | ICD-10-CM

## 2015-07-24 LAB — GLUCOSE, CAPILLARY
GLUCOSE-CAPILLARY: 161 mg/dL — AB (ref 65–99)
GLUCOSE-CAPILLARY: 158 mg/dL — AB (ref 65–99)
GLUCOSE-CAPILLARY: 111 mg/dL — AB (ref 65–99)
Glucose-Capillary: 165 mg/dL — ABNORMAL HIGH (ref 65–99)
Glucose-Capillary: 123 mg/dL — ABNORMAL HIGH (ref 65–99)

## 2015-07-24 LAB — TRIGLYCERIDES: Triglycerides: 85 mg/dL (ref <150)

## 2015-07-24 LAB — SEDIMENTATION RATE: SED RATE: 36 mm/h — AB (ref 0–22)

## 2015-07-24 MED ORDER — HYDRALAZINE HCL 25 MG PO TABS
25.0000 mg | ORAL_TABLET | Freq: Three times a day (TID) | ORAL | Status: DC
  Administered 2015-07-24 – 2015-07-25 (×3): 25 mg via ORAL
  Filled 2015-07-24 (×3): qty 1

## 2015-07-24 MED ORDER — ONDANSETRON HCL 4 MG/2ML IJ SOLN
4.0000 mg | Freq: Four times a day (QID) | INTRAMUSCULAR | Status: DC | PRN
Start: 2015-07-24 — End: 2015-07-28
  Administered 2015-07-24 – 2015-07-25 (×2): 4 mg via INTRAVENOUS
  Filled 2015-07-24 (×2): qty 2

## 2015-07-24 MED ORDER — DEXAMETHASONE SODIUM PHOSPHATE 10 MG/ML IJ SOLN
10.0000 mg | Freq: Four times a day (QID) | INTRAMUSCULAR | Status: DC
  Administered 2015-07-24 – 2015-07-26 (×9): 10 mg via INTRAVENOUS
  Filled 2015-07-24 (×9): qty 1

## 2015-07-24 NOTE — Progress Notes (Signed)
Patient's sister has been making accusations towards nursing and RT for the past two days.  RT went to do 12pm ventilator assessment, patient's sister stated that "RT was lying and putting responsibility on MD" regarding a SBT on 07/23/15.  RN and Dr. Tyson Alias at bedside when patient's sister made comments. RT made charge RN aware of situation.

## 2015-07-24 NOTE — Progress Notes (Signed)
2 RTs present at 4 pm ventilator check and suction. Patient is doing well at this time. Husband at bedside at this time. RT will continue to monitor.

## 2015-07-24 NOTE — Progress Notes (Addendum)
Patient ID: Ernesta Amble, female   DOB: Oct 08, 1956, 59 y.o.   MRN: [id number]  Extensive d/w pt ( alert O x 3), husband, sister Sister is extremely unreasonable and i question her psychiatric stability and judgement Husband very reasonable Sister stated the pt wanted to [facility] Her risk of death is very high if airway lost in ambulance under these circumstances (tonque examination) Such limited benefit for transfer I explained that to the pt and she agreed to stay and husband agreed  We reviewed all the blood tests ordered and care plan  Sister again very unreasonable in her thought process, wanting research medications given for heridtary angio edema without even having a dx and having a low clinical suspicion  Sister is pushing for harmful strategy without ANY medical background just has google as reference  We are trying to discuss some of her concerns with RN Rt care Rn charge trying to change RN if able and safe for this pt and others The sister is derogatory and may need security to remove her She is getting in the way of excellent care by [facility] ICU staff Extremely manipulative behavior   I have spoken to Dr young, Allergist who is kind enough to see her today and speak to husband and sister He agreed to her current treatment at this stage  Paged risk management, no one on call, calling administration on call to notify of circumstance AD to see pt and family.  Ccm time now in total 90 min   Mcarthur Rossetti. Tyson Alias, MD, FACP Pgr: [phone] [place]

## 2015-07-24 NOTE — Progress Notes (Signed)
Nutrition Follow-up  INTERVENTION:   Continue Vital High Protein @ 45 mL/hr with 100 mL free water Q6h. This regimen + Propofol infusion will provide 1370 kcal (87% of needs), 94.5 grams protein, and 1303 mL free water.  RD to continue to monitor  NUTRITION DIAGNOSIS:   Inadequate oral intake related to inability to eat as evidenced by NPO status.  Ongoing.  GOAL:   Patient will meet greater than or equal to 90% of their needs  Meeting.  MONITOR:   Vent status, Weight trends, Labs, I & O's  REASON FOR ASSESSMENT:   Consult Assessment of nutrition requirement/status (TF recommendations)  ASSESSMENT:   59 year old female w/ known h/o asthma and allergies. 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 She woke up her husband and they proceeded to the ER. By the time 40 minutes had gone by the swelling had progressed to the point she was having muffled speech and difficulty w/ saliva. She was electively intubated for airway protection.  Patient is currently intubated on ventilator support MV: 7 L/min Temp (24hrs), Avg:99.9 F (37.7 C), Min:97.9 F (36.6 C), Max:100.8 F (38.2 C)  Propofol: 11 ml/hr -> provides 290 fat kcal  Labs reviewed: CBGs: 111-161  Diet Order:  Diet NPO time specified  Skin:  Reviewed, no issues  Last BM:  PTA  Height:   Ht Readings from Last 1 Encounters:  07/21/15  (1.6 m)    Weight:   Wt Readings from Last 1 Encounters:  07/24/15 173 lb 11.6 oz (78.8 kg)    Ideal Body Weight:  52.27 kg (kg)  BMI:  Body mass index is 30.78 kg/(m^2).  Estimated Nutritional Needs:   Kcal:  1574  Protein:  115-125g  Fluid:  1.6L/day  EDUCATION NEEDS:   No education needs identified at this time  Tilda Franco, MS, RD, LDN Pager: [phone] After Hours Pager: [phone]

## 2015-07-24 NOTE — Consult Note (Signed)
Name: Kayla Henry MRN: [id number] DOB: Sep 01, 1956    ADMISSION DATE:  07/21/2015 CONSULTATION DATE: 07/24/15  REFERRING MD :  Tyson Alias  CHIEF COMPLAINT:  Angioedema  BRIEF PATIENT DESCRIPTION: 59 yo F with angioedema of tongue requiring intubation  SIGNIFICANT EVENTS    STUDIES:  9/4-C1 Esterase inhibitor panel >>   HISTORY OF PRESENT ILLNESS:  Respiratory allergy consult requested by Dr Tyson Alias. 59 yo F now intubated. History from husband. She woke during night 4 days ago with tongue swelling and was brought to ER. Intubated 9/1. No prior angioedema. Husband is not aware of any change of meds or diet, insect sting or recent infection. She had not dropped maintenance singulair. Not on ACE or ARB med. Husband does not know of any family hx of urticaria or angioedema. Patient has a long hx of urticaria and itching without defined trigger. Any formal allergy testing was remote. No reported specific environmental, food, medication or insect sting sensitivity. Asthma managed with singulair, Symbicort, Proair. Allergies w Allegra-D, Flonase.  Prior Nasal polypectomy ( Dr Ezzard Standing) with no hx of aspirin/ NSAID sensitivity.  Do not know of any liver, thyroid or neoplastic disease. Husband is not aware of any change in medications or medication manufacturers, unusual exposures or other trigger for the current situation.  PAST MEDICAL HISTORY :   has a past medical history of Asthma; Allergy; Diverticulosis; RLS (restless legs syndrome); and Chronic sinusitis.  has past surgical history that includes Nasal polyp surgery and Colonoscopy. Prior to Admission medications   Medication Sig Start Date End Date Taking? Authorizing Provider  budesonide-formoterol (SYMBICORT) 160-4.5 MCG/ACT inhaler Inhale 2 puffs into the lungs 2 (two) times daily. 01/10/15  Yes Ronnald Nian, MD  diphenhydrAMINE (SOMINEX) 25 MG tablet Take 50 mg by mouth once.   Yes Historical Provider, MD  fluticasone (FLONASE) 50  MCG/ACT nasal spray USE TWO SPRAY IN EACH NOSTRIL EVERY DAY Patient taking differently: Place 2 sprays into both nostrils daily as needed for allergies.  01/10/15  Yes Ronnald Nian, MD  gabapentin (NEURONTIN) 100 MG capsule Take 100 mg by mouth.    Yes Historical Provider, MD  ibuprofen (ADVIL,MOTRIN) 200 MG tablet Take 400 mg by mouth every 6 (six) hours as needed for headache, mild pain or moderate pain.   Yes Historical Provider, MD  meloxicam (MOBIC) 15 MG tablet TAKE ONE TABLET BY MOUTH ONCE DAILY Patient taking differently: Take 15 mg by mouth daily.  01/10/15  Yes Ronnald Nian, MD  montelukast (SINGULAIR) 10 MG tablet Take 1 tablet (10 mg total) by mouth at bedtime. 01/10/15  Yes Ronnald Nian, MD  rOPINIRole (REQUIP) 1 MG tablet Take 1 tablet (1 mg total) by mouth 3 (three) times daily. 01/10/15  Yes Ronnald Nian, MD   Allergies  Allergen Reactions  . Cephalosporins     hives  . Sulfa Antibiotics Hives    FAMILY HISTORY:  family history is not on file. SOCIAL HISTORY:  reports that she has never smoked. She has never used smokeless tobacco. She reports that she drinks alcohol. She reports that she does not use illicit drugs.  REVIEW OF SYSTEMS:  +=pos Constitutional: Negative for fever, chills, weight loss, malaise/fatigue and diaphoresis.  HENT: Negative for hearing loss, ear pain, nosebleeds, congestion, sore throat, neck pain, tinnitus and ear discharge.   Eyes: Negative for blurred vision, double vision, photophobia, pain, discharge and redness.  Respiratory: Negative for cough, hemoptysis, sputum production, shortness of breath, wheezing and stridor.  Cardiovascular: Negative for chest pain, palpitations, orthopnea, claudication, leg swelling and PND.  Gastrointestinal: Negative for heartburn, nausea, vomiting, abdominal pain, diarrhea, constipation, blood in stool and melena.  Genitourinary: Negative for dysuria, urgency, frequency, hematuria and flank pain.    Musculoskeletal: Negative for myalgias, back pain, joint pain and falls.  Skin: + itching , rash.  Neurological: Negative for dizziness, tingling, tremors, sensory change, speech change, focal weakness, seizures, loss of consciousness, weakness and headaches.  Endo/Heme/Allergies: Negative for environmental allergies and polydipsia. Does not bruise/bleed easily.  SUBJECTIVE:   VITAL SIGNS: Temp:  [97.9 F (36.6 C)-101.5 F (38.6 C)] 100.6 F (38.1 C) (09/04 1500) Pulse Rate:  [55-93] 93 (09/04 1520) Resp:  [14-19] 17 (09/04 1520) BP: (137-199)/(66-107) 159/75 mmHg (09/04 1520) SpO2:  [96 %-100 %] 100 % (09/04 1520) FiO2 (%):  [30 %] 30 % (09/04 1520) Weight:  [78.8 kg (173 lb 11.6 oz)] 78.8 kg (173 lb 11.6 oz) (09/04 0500)  PHYSICAL EXAMINATION: General: Intubated woman, partially sedated by fentanyl Neuro:  Attends, able to write with protective mitten removed HEENT: protruding edematous tongue, oral ETT Cardiovascular:  RRR, no m Lungs:  clear Abdomen: not distended Musculoskeletal: normal muscle bulk Skin:  No visible rash   Recent Labs Lab 07/21/15 0421 07/22/15 0415 07/23/15 0352  NA 140 139 142  K 4.0 4.5 4.1  CL 107 107 109  CO2 27 22 24   BUN 19 11 20   CREATININE 0.86 0.88 0.80  GLUCOSE 98 146* 151*    Recent Labs Lab 07/21/15 0421 07/22/15 0415 07/23/15 0352  HGB 11.9* 13.1 10.7*  HCT 37.3 40.1 33.7*  WBC 7.5 12.4* 12.4*  PLT 223 181 194   Dg Chest Port 1 View  07/23/2015   CLINICAL DATA:  Acute respiratory failure history.  EXAM: PORTABLE CHEST - 1 VIEW  COMPARISON:  07/22/2015  FINDINGS: Endotracheal tube is/ in place tip approximately 3.8 cm above carina. Nasogastric tube is in place with tip off the film but beyond the level of the mid stomach.  There is left perihilar streaky subsegmental atelectasis. Heart size is normal. Mild pulmonary opacities without significant consolidation.  IMPRESSION: Stable appearance of endotracheal tube and nasogastric  tube.  Mild lung opacities with left perihilar atelectasis.   Electronically Signed   By: Norva Pavlov M.D.   On: 07/23/2015 09:18    ASSESSMENT / PLAN:  Angioedema- Usual effort is to distinguish between mast cell mediated/ allergic, and bradykinin mediated- hereditary/ C1 esterase types. Other mechanisms are not well understood. So far, there is no evidence of generalized anaphylaxis or acute asthma. Her hx of long-standing recurrent hives, pruritus and lack of family hx favor this being mast cell/ allergy type. Treatment is based on H1 and H2 antihistamines, steroids, and airway support. She is now getting benadryl (H1), famotidine (H2),  decadron, and singulair (leukotriene inhib). There was question of transferring her to [facility], but I agree with Dr Tyson Alias that if her airway became disrupted during transport, it would be very difficult to restore. The issue can be reconsidered if she doesn't begin to improve soon. I certainly support outpatient assessment at a [facility] allergy program.  Plan- Continue present meds          If C1- esterase inhibitor panel returns abnormal, the pharmacy does stock Berinert,  Human C-1 esterase inhibitor.  CD Maple Hudson, MD Pulmonary and Critical Care Medicine [facility] Pager: [phone]  07/24/2015, 4:10 PM

## 2015-07-24 NOTE — Progress Notes (Addendum)
PULMONARY / CRITICAL CARE MEDICINE   Name: Kayla Henry MRN: [id number] DOB: [birth year]    ADMISSION DATE:  07/21/2015 CONSULTATION DATE:   9/1  REFERRING MD :  Lita Mains   CHIEF COMPLAINT:   Angioedema   INITIAL PRESENTATION:  59 year old female w/ h/o allergies and asthma admitted early am hours of 9/1 w/ angioedema w/ airway compromise; etiology unclear on presentation.    STUDIES:   SIGNIFICANT EVENTS: 9/1 - intubated 9/3- sister requested 4 am, MD to bedside for further explanations if care plan  SUBJECTIVE:  Awake, weaning  VITAL SIGNS: Temp:  [97.9 F (36.6 C)-100.8 F (38.2 C)] 99.9 F (37.7 C) (09/04 0900) Pulse Rate:  [55-77] 77 (09/04 0900) Resp:  [0-19] 17 (09/04 0900) BP: (136-199)/(66-107) 172/85 mmHg (09/04 0900) SpO2:  [95 %-100 %] 98 % (09/04 0900) FiO2 (%):  [30 %] 30 % (09/04 0900) Weight:  [78.8 kg (173 lb 11.6 oz)] 78.8 kg (173 lb 11.6 oz) (09/04 0500) HEMODYNAMICS:   VENTILATOR SETTINGS: Vent Mode:  [-] PRVC FiO2 (%):  [30 %] 30 % Set Rate:  [14 bmp] 14 bmp Vt Set:  [420 mL] 420 mL PEEP:  [5 cmH20] 5 cmH20 Plateau Pressure:  [18 cmH20-22 cmH20] 18 cmH20 INTAKE / OUTPUT:  Intake/Output Summary (Last 24 hours) at 07/24/15 1110 Last data filed at 07/24/15 0947  Gross per 24 hour  Intake 3350.41 ml  Output   2840 ml  Net 510.41 ml    PHYSICAL EXAMINATION: General: vent, calm Neuro:  Sedated. Fc, rass 0 HEENT:  Kamrar, orally intubated. Tongue is swollen and large with little improvement. Protrudes out of mouth, neck without fluctanace or swollen Cardiovascular:  s1 s2 rrr Lungs:  CTA Abdomen: soft, non-tender , no r/g, BS wnl Musculoskeletal:  Good tone  Skin:  Intact , no rash or bite marks  LABS:  CBC  Recent Labs Lab 07/21/15 0421 07/22/15 0415 07/23/15 0352  WBC 7.5 12.4* 12.4*  HGB 11.9* 13.1 10.7*  HCT 37.3 40.1 33.7*  PLT 223 181 194   Coag's No results for input(s): APTT, INR in the last 168 hours. BMET  Recent  Labs Lab 07/21/15 0421 07/22/15 0415 07/23/15 0352  NA 140 139 142  K 4.0 4.5 4.1  CL 107 107 109  CO2 27 22 24   BUN 19 11 20   CREATININE 0.86 0.88 0.80  GLUCOSE 98 146* 151*   Electrolytes  Recent Labs Lab 07/21/15 0421 07/21/15 0435 07/22/15 0415 07/23/15 0352  CALCIUM 9.3  --  8.8* 8.5*  MG  --  2.1 2.1  --   PHOS  --  4.3 3.7  --    Sepsis Markers No results for input(s): LATICACIDVEN, PROCALCITON, O2SATVEN in the last 168 hours. ABG  Recent Labs Lab 07/21/15 0605  PHART 7.309*  PCO2ART 45.9*  PO2ART 372*   Liver Enzymes No results for input(s): AST, ALT, ALKPHOS, BILITOT, ALBUMIN in the last 168 hours. Cardiac Enzymes No results for input(s): TROPONINI, PROBNP in the last 168 hours. Glucose  Recent Labs Lab 07/23/15 1257 07/23/15 1647 07/23/15 2024 07/24/15 0032 07/24/15 0410 07/24/15 0800  GLUCAP 139* 98 149* 165* 161* 158*    Imaging No results found.   Immune data Crp>>>0.5 C4>>> 36 c1 q esterase>>> Ana Esr    ASSESSMENT / PLAN:  PULMONARY OETT 9/1 A: Angioedema- etiology unclear likely related to food allergen  R/o acquired c1 q esterase def or dysfxn, r/o association iron def ( does have restless leg)  VDRF d/t ineffective airway clearance  H/o asthma  P:   Daily SBT cpap 5 ps 5, goal 2 hr then back to rest as unable to extubate with tongue size duonebs q6h prn H1 and H2 blockade- maintain scheduled further Steroids, limited improvement last 24 hr, change to decadron ith less mineralocorticoid activity trach surg airway kit in room  Monitor cuff leak when weaning successful and tongue resolved Send esr, ana Although crp 0.5 hsv again unlikley , but sent  CARDIOVASCULAR CVL -none A:  MILD HTN P:  Tele  hr 60, add hydral as remains HTN  In future as outputt would use HCTZ , avoiding now  RENAL A:   Normal renal FXN, on high dose steroids P:   kvo her BUN may rise on roids, repeat chem in am  Urine output  good  GASTROINTESTINAL A:   Tolerating TF Mild N P:   Scheduled H2 blockade  TFs assess for BM Add zofran  HEMATOLOGIC A:   Mild anemia (normocytic/normochromic)- now some dilution likely (pos 2 lite overnight)_ No sig iron def P:  [place] heparin Trend CBC in am  kvo to fluids  INFECTIOUS / allergy A:   No evidence of infection  Can have infective glossitis (unlikley) - unlear etiology P:   HSV pending Will need skin and rass testing for tree nuts  ENDOCRINE A:   On higher dose steroids P:   Trend glucose  ssi add with decadron  NEUROLOGIC A:   Pain Vent dyschrony mild P:   RASS goal: -2 PAD protocol  -propofol gtt Ensure tg sent WUA Avoid self extubation, trach kit placed bedside Would not recommend transfer with such high risk ett lost in ambulance and death with limited benefit gain from transfer Will d/w family  Would be unethical to transfer her with such risk  FAMILY  - Updates: husband & sister at bedside -addressed all concerns- see separate discussion  - Inter-disciplinary family meet or Palliative Care meeting due by: 9/8  Ccm time 45 min   Lavon Paganini. Titus Mould, MD, [place] Pgr: [place]

## 2015-07-24 NOTE — Progress Notes (Signed)
Approximately 2200 (9/3) patient's sister states patient wants to be transferred to another hospital, does not think she's getting appropriate care here.  Sister requests I call Doctor to get this process started.  Pt nods when I asked if she wants to transfer to another hospital, nods when asked if she believes she is not getting appropriate care here, nods when asked if she wants me to call doctor to discuss a transfer.  Assessed patient with SAVE A HAART, patient squeezed hand for all 10 letters.  Charge nurse entered room to help with assessment of patient orientation. Pt indicates she wants to write. Pt wrote on clipboard, incomplete or illegible words.  Pt did write "[facility]" when asked where she was.  Pt very drowsy, falling asleep some while being spoken to.  Spoke with Dr Dellie Catholic Pola Corn) around 2230 concerning patient and Pt's sister's request, He said Dr Jamison Neighbor may be able to come speak tonight to patient and family.  Dr Jamison Neighbor arrived at patient's room shortly after midnight; Patient's sister, and Patient's niece were present.

## 2015-07-24 NOTE — Progress Notes (Signed)
PCCM Attending Note: I had a lengthy discussion with the patient's sister at bedside and 2 brothers via phone. I also had the opportunity to speak with the patient's husband via phone separate from the conversation with her siblings. On my arrival in the room the patient was sitting up in bed intubated and on a propofol infusion for sedation. She was writing to family members while also dozing off. Sister reported that the patient had requested a transfer to [facility] in [place]. I asked the patient what year it was and she wrote the wrong year repeatedly. She also was unable to answer the correct month. I then asked the patient is a president was and she was unable to correctly spell his name but seemed that she was trying to say Obama. I explained to all involved that given these answers and the multitude of medications she is on including sedation I do not believe that she is competent to make her own medical decisions at this exact time. I also explained that it would be best if we could have her husband present to participate in the decisions regarding her care. I explained that none of the physicians in her care are opposed to inquiring about transferring her to a tertiary care center. To that end I explained that Dr. Tyson Alias will be back in the morning to round on the patient and with her husband present at that time we will attempt a family discussion with complete cessation of her propofol infusion in an effort to increase her awareness and participation in this discussion. This plan was discussed with the patient's nurse & the unit charge nurse as well. I offered to answer any questions that the family or husband had at the time.  Donna Christen Jamison Neighbor, M.D. [place] Pager:  [phone] After 3pm or if no response, call [phone]

## 2015-07-24 NOTE — Progress Notes (Signed)
Rt suction pt with no complications family at bedside.

## 2015-07-25 ENCOUNTER — Inpatient Hospital Stay (HOSPITAL_COMMUNITY): Payer: BC Managed Care – PPO

## 2015-07-25 LAB — COMPREHENSIVE METABOLIC PANEL
ALBUMIN: 3.2 g/dL — AB (ref 3.5–5.0)
ALK PHOS: 59 U/L (ref 38–126)
ALT: 92 U/L — ABNORMAL HIGH (ref 14–54)
ANION GAP: 7 (ref 5–15)
AST: 35 U/L (ref 15–41)
BILIRUBIN TOTAL: 0.5 mg/dL (ref 0.3–1.2)
BUN: 23 mg/dL — AB (ref 6–20)
CALCIUM: 8.4 mg/dL — AB (ref 8.9–10.3)
CO2: 30 mmol/L (ref 22–32)
Chloride: 103 mmol/L (ref 101–111)
Creatinine, Ser: 0.74 mg/dL (ref 0.44–1.00)
GFR calc Af Amer: >60 mL/min (ref >60)
GFR calc non Af Amer: >60 mL/min (ref >60)
GLUCOSE: 154 mg/dL — AB (ref 65–99)
Potassium: 4 mmol/L (ref 3.5–5.1)
SODIUM: 140 mmol/L (ref 135–145)
Total Protein: 6.4 g/dL — ABNORMAL LOW (ref 6.5–8.1)

## 2015-07-25 LAB — GLUCOSE, CAPILLARY
GLUCOSE-CAPILLARY: 121 mg/dL — AB (ref 65–99)
GLUCOSE-CAPILLARY: 134 mg/dL — AB (ref 65–99)
GLUCOSE-CAPILLARY: 134 mg/dL — AB (ref 65–99)
GLUCOSE-CAPILLARY: 146 mg/dL — AB (ref 65–99)
GLUCOSE-CAPILLARY: 147 mg/dL — AB (ref 65–99)
GLUCOSE-CAPILLARY: 155 mg/dL — AB (ref 65–99)
GLUCOSE-CAPILLARY: 171 mg/dL — AB (ref 65–99)

## 2015-07-25 LAB — CBC WITH DIFFERENTIAL/PLATELET
BASOS PCT: 0 % (ref 0–1)
Basophils Absolute: 0 10*3/uL (ref 0.0–0.1)
EOS PCT: 0 % (ref 0–5)
Eosinophils Absolute: 0 10*3/uL (ref 0.0–0.7)
HCT: 37.8 % (ref 36.0–46.0)
Hemoglobin: 12.5 g/dL (ref 12.0–15.0)
LYMPHS PCT: 15 % (ref 12–46)
Lymphs Abs: 1.6 10*3/uL (ref 0.7–4.0)
MCH: 28.8 pg (ref 26.0–34.0)
MCHC: 33.1 g/dL (ref 30.0–36.0)
MCV: 87.1 fL (ref 78.0–100.0)
MONO ABS: 1.1 10*3/uL — AB (ref 0.1–1.0)
MONOS PCT: 11 % (ref 3–12)
NEUTROS ABS: 8.1 10*3/uL — AB (ref 1.7–7.7)
Neutrophils Relative %: 74 % (ref 43–77)
Platelets: 174 10*3/uL (ref 150–400)
RBC: 4.34 MIL/uL (ref 3.87–5.11)
RDW: 15.7 % — ABNORMAL HIGH (ref 11.5–15.5)
WBC: 10.9 10*3/uL — ABNORMAL HIGH (ref 4.0–10.5)

## 2015-07-25 LAB — HERPES SIMPLEX VIRUS(HSV) DNA BY PCR
HSV 1 DNA: NEGATIVE
HSV 2 DNA: NEGATIVE

## 2015-07-25 LAB — MAGNESIUM
Magnesium: 2.3 mg/dL (ref 1.7–2.4)
Magnesium: 2.3 mg/dL (ref 1.7–2.4)

## 2015-07-25 LAB — PHOSPHORUS
Phosphorus: 2.4 mg/dL — ABNORMAL LOW (ref 2.5–4.6)
Phosphorus: 2.8 mg/dL (ref 2.5–4.6)

## 2015-07-25 MED ORDER — HYDRALAZINE HCL 50 MG PO TABS
50.0000 mg | ORAL_TABLET | Freq: Three times a day (TID) | ORAL | Status: DC
  Administered 2015-07-25 – 2015-07-26 (×3): 50 mg via ORAL
  Filled 2015-07-25 (×3): qty 1

## 2015-07-25 MED ORDER — SODIUM CHLORIDE 0.9 % IV SOLN
INTRAVENOUS | Status: DC
  Administered 2015-07-25: 10:00:00 via INTRAVENOUS

## 2015-07-25 MED ORDER — C1 ESTERASE INHIBITOR (HUMAN) 500 UNITS IV KIT
20.0000 [IU]/kg | PACK | Freq: Once | INTRAVENOUS | Status: AC
  Administered 2015-07-25: 1500 [IU] via INTRAVENOUS
  Filled 2015-07-25: qty 1500

## 2015-07-25 MED ORDER — LEVOFLOXACIN IN D5W 750 MG/150ML IV SOLN
750.0000 mg | Freq: Every day | INTRAVENOUS | Status: DC
  Administered 2015-07-25 – 2015-07-26 (×2): 750 mg via INTRAVENOUS
  Filled 2015-07-25 (×3): qty 150

## 2015-07-25 MED ORDER — POLYETHYLENE GLYCOL 3350 17 G PO PACK
17.0000 g | PACK | Freq: Every day | ORAL | Status: DC
  Administered 2015-07-25 – 2015-07-27 (×3): 17 g via ORAL
  Filled 2015-07-25 (×3): qty 1

## 2015-07-25 MED ORDER — FUROSEMIDE 10 MG/ML IJ SOLN
20.0000 mg | Freq: Two times a day (BID) | INTRAMUSCULAR | Status: DC
  Administered 2015-07-25 – 2015-07-26 (×3): 20 mg via INTRAVENOUS
  Filled 2015-07-25 (×3): qty 2

## 2015-07-25 NOTE — Progress Notes (Signed)
Sputum sample obtained and sent down to main lab without complications.  

## 2015-07-25 NOTE — Progress Notes (Signed)
eLink Physician-Brief Progress Note Patient Name: Kayla Henry DOB: [birth year] MRN: [id number]   Date of Service  07/25/2015  HPI/Events of Note  RN calling to inform that sister now obstructing care - trying to convince pt  Who is sedated on propofol gtt ,   eICU Interventions  husband is HCPOA, prior notes reviewed     Intervention Category Intermediate Interventions: Communication with other healthcare providers and/or family  Emanuelle Bastos V. 07/25/2015, 6:13 PM

## 2015-07-25 NOTE — Progress Notes (Addendum)
Husband spoke with this RN and bedside RN, and [facility] in private. He voices concerns that AJ, pt's sister has convinced his wife that she needs to be transferred to another facility. His wife is now requesting to be transferred.  I spoke with Dr Vassie Loll and updated him on current situation. I then spoke with pt reiterating the danger of transferring as Dr Hazle Quant had  Informed her of explaining if the ETT came out it could not be put back in safely in a timely manner with the massive amount of swelling and death could occur. Sister, Forde Radon, loudly states, "that's a lie."  Sister was asked to leave by [facility], accompanied by this RN, bedside RN and security with [employer] standing by in hallway. She did leave after talking with pt and informing her that we've asked her to leave. AJ stated to her daughter, pt's niece, to get our names and "are you recording this".  The pt then begin to write notes saying she wants to leave. She is in charge. Note the pt is on Propofol at 40 mcg continuous drip.The niece was very upset, pacing in room from desk area to pt and back and forth getting papers, etc. She started speaking against pt's husband to the pt stating several things including that she's calling all her aunts and uncles and tell them what we are doing. She stated she and her mom have been here for 6 days and nights now never leaving her side. That he, speaking of husband, has left every night. All this said to pt as niece was leaning over bed talking to pt in the face. She had stated earlier that her mom was tired. Niece exited to the waiting room. Husband asked [place] for the niece to leave also. This RN, AC, and security went to waiting room and informed niece to leave premises also after collecting her belongings.

## 2015-07-25 NOTE — Progress Notes (Signed)
Patient ID: Kayla Henry, female   DOB: 1956-05-27, 59 y.o.   MRN: [id number]  Nice conversation with husband,MED POA Also sister present, RN Pt rass deep and unable to understand complex decision making at present She had requested deeper sedation, she also had just received fentanyl bolus  I d/w husband still the high risk transfer and plan for empiric C1 Esterase with lack of progress and pending c1 est level We discussed risks of that medication and SE.  He understood and accepted this plan.  He also again stated he agreed that transfer would be too high risk at this stage We do have allergists/ immunologist consult which is fortunate  The NON medical POA sister voiced her opinion that the pt should undergo risk of death with unstable airway to [facility] or other institution. Her reasoning was unclear except her disappointment in some RN care that has NOT been validated or realistic. Even though i had allergist consult, she just wanted more immunologists to see her. I stressed to her that the RISK / benefit ratio of transfer today does not favor transport, nor does the med poa husband want this. She stated that ALL critical care transfers go fine and she read about it on the internet.  This is unfortunately NOT true and in fact stable airways are lost in ambulances of course secondary to movement of pt on to stretchers and in to ambulance etc.  Also sister mentioned that she was spoken to by charge RN and Administration that she may be escorted out of hospital if her behavior continued to interfere with pt care. I could NOT agree more that her erratic anger and outbursts to RN directly and RT has created concerns for staff to NOT care for her with the high standard we demand. I put a call into risk management yesterday and notified them of this circumstance.  I question her psychological stability in particular her judgement in requesting and manipulating the pt responses to promote transfer.  (In such a high risk unstable airway and additional therapy that an outside hospital unlikely to provide). I also requested again a list of additional research googled medications I said I would review for her. I have not been provided this list.  Mcarthur Rossetti. Tyson Alias, MD, FACP Pgr: [phone] [place] [facility]

## 2015-07-25 NOTE — Progress Notes (Signed)
Patient placed on SBT at 8:38 this morning. Patient tolerated SBT well and is resting comfortably RT will continue to monitor patient.

## 2015-07-25 NOTE — Progress Notes (Signed)
Husband of patient requested to speak privately to this RN and Consulting civil engineer. AC also joined conversation in private room. Husband reports that the patient's sister, Forde Radon, is trying to convince patient to transfer to a different hospital even though it is against medical advice. Patient's husband requested that sister be asked to leave the hospital indefinitely. Charge RN and [facility] returned to the patient's room with security and [employer] present and asked the sister to leave. The sister calmly left and her daughter, the patient's niece, stayed. The patient's niece then began to speak negatively about the patient's husband and, per husband's request, the niece was asked to leave the hospital as well. She then became irate and told the patient that she would call the rest of the family and tell them what her husband had done. The sister and niece are now gone and the patient is resting calmly with husband at bedside.

## 2015-07-25 NOTE — Progress Notes (Addendum)
PULMONARY / CRITICAL CARE MEDICINE   Name: Kayla Henry MRN: [id number] DOB: 06-13-1956    ADMISSION DATE:  07/21/2015 CONSULTATION DATE:   9/1  REFERRING MD :  Lita Mains   CHIEF COMPLAINT:   Angioedema   INITIAL PRESENTATION:  59 year old female w/ h/o allergies and asthma admitted early am hours of 9/1 w/ angioedema w/ airway compromise; etiology unclear on presentation.    STUDIES:   SIGNIFICANT EVENTS: 9/1 - intubated 9/3- sister requested 4 am, MD to bedside for further explanations if care plan 9/4- allergist consult appreciated  SUBJECTIVE:  rass -3, secretions tan and increased  VITAL SIGNS: Temp:  [100 F (37.8 C)-101.7 F (38.7 C)] 100.4 F (38 C) (09/05 0900) Pulse Rate:  [58-93] 71 (09/05 0900) Resp:  [8-20] 14 (09/05 0900) BP: (126-180)/(58-92) 162/78 mmHg (09/05 0900) SpO2:  [96 %-100 %] 98 % (09/05 0900) FiO2 (%):  [30 %] 30 % (09/05 0836) Weight:  [79.6 kg (175 lb 7.8 oz)] 79.6 kg (175 lb 7.8 oz) (09/05 0400) HEMODYNAMICS:   VENTILATOR SETTINGS: Vent Mode:  [-] PRVC FiO2 (%):  [30 %] 30 % Set Rate:  [14 bmp] 14 bmp Vt Set:  [420 mL] 420 mL PEEP:  [5 cmH20] 5 cmH20 Plateau Pressure:  [14 cmH20-19 cmH20] 18 cmH20 INTAKE / OUTPUT:  Intake/Output Summary (Last 24 hours) at 07/25/15 0940 Last data filed at 07/25/15 0935  Gross per 24 hour  Intake 1746.64 ml  Output   2065 ml  Net -318.36 ml    PHYSICAL EXAMINATION: General:  rass -2 Neuro:  rass -2, moves all  HEENT:  [place], orally intubated. Tongue is swollen less tense, no sig improvement in size Cardiovascular:  s1 s2 rrr Lungs:  coarse Abdomen: soft, non-tender , no r/g, BS wnl Musculoskeletal:  Good tone, no foot drop  Skin:  Intact , no rash  LABS:  CBC  Recent Labs Lab 07/22/15 0415 07/23/15 0352 07/25/15 0423  WBC 12.4* 12.4* 10.9*  HGB 13.1 10.7* 12.5  HCT 40.1 33.7* 37.8  PLT 181 194 174   Coag's No results for input(s): APTT, INR in the last 168  hours. BMET  Recent Labs Lab 07/22/15 0415 07/23/15 0352 07/25/15 0423  NA 139 142 140  K 4.5 4.1 4.0  CL 107 109 103  CO2 22 24 30   BUN 11 20 23*  CREATININE 0.88 0.80 0.74  GLUCOSE 146* 151* 154*   Electrolytes  Recent Labs Lab 07/21/15 0435 07/22/15 0415 07/23/15 0352 07/25/15 0423  CALCIUM  --  8.8* 8.5* 8.4*  MG 2.1 2.1  --   --   PHOS 4.3 3.7  --   --    Sepsis Markers No results for input(s): LATICACIDVEN, PROCALCITON, O2SATVEN in the last 168 hours. ABG  Recent Labs Lab 07/21/15 0605  PHART 7.309*  PCO2ART 45.9*  PO2ART 372*   Liver Enzymes  Recent Labs Lab 07/25/15 0423  AST 35  ALT 92*  ALKPHOS 59  BILITOT 0.5  ALBUMIN 3.2*   Cardiac Enzymes No results for input(s): TROPONINI, PROBNP in the last 168 hours. Glucose  Recent Labs Lab 07/24/15 1230 07/24/15 1703 07/24/15 2012 07/25/15 0035 07/25/15 0422 07/25/15 0746  GLUCAP 111* 123* 146* 147* 134* 121*    Imaging Dg Chest Port 1 View  07/25/2015   CLINICAL DATA:  59 year old female intubated patient. History of angioedema.  EXAM: PORTABLE CHEST - 1 VIEW  COMPARISON:  Chest x-ray 07/23/2015.  FINDINGS: An endotracheal tube is in place  with tip 4.6 cm above the carina. A nasogastric tube is seen extending into the stomach, however, the tip of the nasogastric tube extends below the lower margin of the image. Lung volumes are low. Increasing small right pleural effusion with probable passive subsegmental atelectasis in the right lower lobe. Left lung is clear. Crowding of the pulmonary vasculature, accentuated by low lung volumes, without frank pulmonary edema. Heart size is normal. Mediastinal contours are within normal limits allowing for patient position.  IMPRESSION: 1. Support apparatus, as above. 2. Interval development of small right pleural effusion with passive subsegmental atelectasis in the right lower lobe.   Electronically Signed   By: Vinnie Langton M.D.   On: 07/25/2015 08:01      Immune data Crp>>>0.5 C4>>> 36 c1 q esterase>>> Ana>>> Esr>>>36 DSDNA>>>  ASSESSMENT / PLAN:  PULMONARY OETT 9/1 A: Angioedema- etiology unclear likely related to food allergen  R/o acquired c1 q esterase def or dysfxn H/o asthma  Rt base atx 9/5 P:   Maintain benadryl likely to reduce this in am  pepcid maintain Decadron reduce in am  She is high risk death for transfer, she has made min progress, appreciate allergy consult, will d/w family use of empiric Berinert ( c1 inhibitor ) as we are unable to transfer safely Await level c1 esterase  Effusion . atx rt base, add levofloxacin, send sputum, lasix pcxr follow up in am  Esr mild elevated, not overhwelming Sent and await dsdna, ana Wean sbt , unable to extubate When time for extubation, would extubate with ENT assistance  CARDIOVASCULAR CVL -none A:  MILD HTN P:  Tele  hydral , increase May need additional agents  RENAL A:   Normal renal FXN, on high dose steroids, effusion P:   Lasix start to goal neg 1 liter Chem in am  Chem in pm with lasix start  GASTROINTESTINAL A:   Tolerating TF Mild N P:   Scheduled H2 blockade  TFs Need BM, add mirilax zofran especially if berinert used  HEMATOLOGIC A:   Mild anemia (normocytic/normochromic)- now some dilution likely (pos 2 lite overnight)_ No sig iron def Berinert can increase risk thrombosis P:  [place] heparin Trend CBC with lasix start kvo to fluids Limit berinert to one dose  INFECTIOUS / allergy A:   No evidence of infection  Can have infective glossitis (unlikley) - unlear etiology Concern low grade fever, hazziness atx rt base on steroids P:   HSV pending tongue Add sputum 9/5 levofloxacin>>> Repeat pcxr in am   ENDOCRINE A:   On higher dose steroids P:   Trend glucose  ssi add with decadron  NEUROLOGIC A:   Pain Vent dyschrony and severe discomfort when awake per pt requests increased sedation P:   RASS goal: -2 to -3   PAD protocol  -propofol gtt (trig 85) WUA ok  Avoid self extubation, trach kit placed bedside Would not recommend transfer with such high risk husband agrees  FAMILY  - Updates: husband & sister at bedside -addressed all concerns- see separate discussion daily  - Inter-disciplinary family meet or Palliative Care meeting due by: 9/8  Ccm time 40 min   Lavon Paganini. Titus Mould, MD, [place] Pgr: [facility]

## 2015-07-25 NOTE — Progress Notes (Signed)
ANTIBIOTIC CONSULT NOTE - INITIAL  Pharmacy Consult for Levaquin Indication: HCAP  Allergies  Allergen Reactions  . Cephalosporins     hives  . Sulfa Antibiotics Hives    Patient Measurements: Height:  (160 cm) Weight: 175 lb 7.8 oz (79.6 kg) IBW/kg (Calculated) : 52.4 Adjusted Body Weight:   Vital Signs: Temp: 100.4 F (38 C) (09/05 0900) Temp Source: Core (Comment) (09/05 0800) BP: 162/78 mmHg (09/05 0900) Pulse Rate: 71 (09/05 0900) Intake/Output from previous day: 09/04 0701 - 09/05 0700 In: 1751.8 [I.V.:566.8; NG/GT:1085; IV Piggyback:100] Out: 2025 [Urine:2025] Intake/Output from this shift: Total I/O In: 289 [I.V.:104; NG/GT:135; IV Piggyback:50] Out: 175 [Urine:175]  Labs:  Recent Labs  07/23/15 0352 07/25/15 0423  WBC 12.4* 10.9*  HGB 10.7* 12.5  PLT 194 174  CREATININE 0.80 0.74   Estimated Creatinine Clearance: 73.8 mL/min (by C-G formula based on Cr of 0.74). No results for input(s): VANCOTROUGH, VANCOPEAK, VANCORANDOM, GENTTROUGH, GENTPEAK, GENTRANDOM, TOBRATROUGH, TOBRAPEAK, TOBRARND, AMIKACINPEAK, AMIKACINTROU, AMIKACIN in the last 72 hours.   Microbiology: Recent Results (from the past 720 hour(s))  MRSA PCR Screening     Status: Abnormal   Collection Time: 07/21/15 11:22 AM  Result Value Ref Range Status   MRSA by PCR POSITIVE (A) NEGATIVE Final    Comment:        The GeneXpert MRSA Assay (FDA approved for NASAL specimens only), is one component of a comprehensive MRSA colonization surveillance program. It is not intended to diagnose MRSA infection nor to guide or monitor treatment for MRSA infections. RESULT CALLED TO, READ BACK BY AND VERIFIED WITH: FARRL ON 07/21/15 AT 1515 BY INGLEE     Medical History: Past Medical History  Diagnosis Date  . Asthma   . Allergy   . Diverticulosis   . RLS (restless legs syndrome)   . Chronic sinusitis    Assessment: Pt is 59 yo female diagnosed with angioedema, leading to  intubation.  CXR 9/5 shows development of small atelectasis in RLL and pharmacy consulted to start levaquin for HCAP.   Pt with good renal function and no allergies to fluoroquinalones noted.   Goal of Therapy:  Eradication of infection  Plan:  Start levaquin  IV q24h F/u sputum culture, renal fxn, clinical course  Haynes Hoehn, PharmD, BCPS 07/25/2015, 10:15 AM  Pager: [phone]

## 2015-07-26 ENCOUNTER — Inpatient Hospital Stay (HOSPITAL_COMMUNITY): Payer: BC Managed Care – PPO

## 2015-07-26 DIAGNOSIS — J189 Pneumonia, unspecified organism: Secondary | ICD-10-CM

## 2015-07-26 DIAGNOSIS — J9601 Acute respiratory failure with hypoxia: Secondary | ICD-10-CM

## 2015-07-26 DIAGNOSIS — J1520 Pneumonia due to staphylococcus, unspecified: Secondary | ICD-10-CM

## 2015-07-26 LAB — BASIC METABOLIC PANEL
Anion gap: 7 (ref 5–15)
BUN: 30 mg/dL — ABNORMAL HIGH (ref 6–20)
CHLORIDE: 98 mmol/L — AB (ref 101–111)
CO2: 32 mmol/L (ref 22–32)
CREATININE: 0.82 mg/dL (ref 0.44–1.00)
Calcium: 7.9 mg/dL — ABNORMAL LOW (ref 8.9–10.3)
GFR calc non Af Amer: >60 mL/min (ref >60)
Glucose, Bld: 191 mg/dL — ABNORMAL HIGH (ref 65–99)
POTASSIUM: 3.6 mmol/L (ref 3.5–5.1)
Sodium: 137 mmol/L (ref 135–145)

## 2015-07-26 LAB — GLUCOSE, CAPILLARY
GLUCOSE-CAPILLARY: 188 mg/dL — AB (ref 65–99)
Glucose-Capillary: 136 mg/dL — ABNORMAL HIGH (ref 65–99)
Glucose-Capillary: 162 mg/dL — ABNORMAL HIGH (ref 65–99)
Glucose-Capillary: 164 mg/dL — ABNORMAL HIGH (ref 65–99)
Glucose-Capillary: 166 mg/dL — ABNORMAL HIGH (ref 65–99)
Glucose-Capillary: 264 mg/dL — ABNORMAL HIGH (ref 65–99)

## 2015-07-26 LAB — PHOSPHORUS: Phosphorus: 2.8 mg/dL (ref 2.5–4.6)

## 2015-07-26 LAB — C1 ESTERASE INHIBITOR: C1 ESTERASE INH: 35 mg/dL (ref 21–39)

## 2015-07-26 LAB — MAGNESIUM: MAGNESIUM: 2.5 mg/dL — AB (ref 1.7–2.4)

## 2015-07-26 LAB — ANTI-DNA ANTIBODY, DOUBLE-STRANDED: ds DNA Ab: <1 IU/mL (ref 0–9)

## 2015-07-26 MED ORDER — DEXAMETHASONE SODIUM PHOSPHATE 10 MG/ML IJ SOLN: 5.0000 mg | Freq: Four times a day (QID) | INTRAMUSCULAR | Status: DC

## 2015-07-26 MED ORDER — VANCOMYCIN HCL IN DEXTROSE 1-5 GM/200ML-% IV SOLN
1000.0000 mg | INTRAVENOUS | Status: AC
  Administered 2015-07-26: 1000 mg via INTRAVENOUS
  Filled 2015-07-26: qty 200

## 2015-07-26 MED ORDER — METHYLPREDNISOLONE SODIUM SUCC 125 MG IJ SOLR
60.0000 mg | Freq: Four times a day (QID) | INTRAMUSCULAR | Status: DC
  Administered 2015-07-26 – 2015-07-28 (×8): 60 mg via INTRAVENOUS
  Filled 2015-07-26 (×9): qty 2

## 2015-07-26 MED ORDER — VANCOMYCIN HCL IN DEXTROSE 1-5 GM/200ML-% IV SOLN
1000.0000 mg | Freq: Two times a day (BID) | INTRAVENOUS | Status: DC
  Administered 2015-07-26 – 2015-07-28 (×4): 1000 mg via INTRAVENOUS
  Filled 2015-07-26 (×4): qty 200

## 2015-07-26 MED ORDER — CHLORHEXIDINE GLUCONATE 0.12 % MT SOLN
OROMUCOSAL | Status: AC
  Administered 2015-07-26: 15 mL via OROMUCOSAL
  Filled 2015-07-26: qty 15

## 2015-07-26 NOTE — Progress Notes (Signed)
PULMONARY / CRITICAL CARE MEDICINE   Name: Kayla Henry MRN: [id number] DOB: Jan 03, 1956    ADMISSION DATE:  07/21/2015 CONSULTATION DATE:   9/1  REFERRING MD :  Lita Mains   CHIEF COMPLAINT:   Angioedema   INITIAL PRESENTATION:  59 year old female w/ h/o allergies and asthma admitted early am hours of 9/1 w/ angioedema w/ airway compromise; etiology unclear on presentation.    STUDIES:   SIGNIFICANT EVENTS: 9/1 - intubated 9/3- sister requested 4 am, MD to bedside for further explanations if care plan 9/4- allergist consult appreciated  SUBJECTIVE:  Febrile Moderate secretions from ETT Drooling Tongue remains swollen C/o pain from ETT Weans well on PS 10/5  VITAL SIGNS: Temp:  [100 F (37.8 C)-101.5 F (38.6 C)] 100.4 F (38 C) (09/06 1200) Pulse Rate:  [62-86] 62 (09/06 1200) Resp:  [14-18] 14 (09/06 1100) BP: (81-165)/(45-95) 83/45 mmHg (09/06 1200) SpO2:  [89 %-98 %] 95 % (09/06 1200) FiO2 (%):  [30 %] 30 % (09/06 0850) Weight:  [80 kg (176 lb 5.9 oz)] 80 kg (176 lb 5.9 oz) (09/06 0800) HEMODYNAMICS:   VENTILATOR SETTINGS: Vent Mode:  [-] CPAP;PSV FiO2 (%):  [30 %] 30 % Set Rate:  [14 bmp] 14 bmp Vt Set:  [420 mL] 420 mL PEEP:  [5 cmH20] 5 cmH20 Pressure Support:  [5 cmH20-10 cmH20] 10 cmH20 Plateau Pressure:  [17 cmH20-19 cmH20] 17 cmH20 INTAKE / OUTPUT:  Intake/Output Summary (Last 24 hours) at 07/26/15 1309 Last data filed at 07/26/15 1200  Gross per 24 hour  Intake 2162.88 ml  Output   1925 ml  Net 237.88 ml    PHYSICAL EXAMINATION: General:  Acutely ill,  Neuro:  rass 0, moves all , able to write HEENT:  [place], orally intubated. Tongue is swollen less tense, no sig improvement in size Cardiovascular:  s1 s2 rrr Lungs:  coarse Abdomen: soft, non-tender , no r/g, BS wnl Musculoskeletal:  Good tone, no foot drop  Skin:  Intact , no rash  LABS:  CBC  Recent Labs Lab 07/22/15 0415 07/23/15 0352 07/25/15 0423  WBC 12.4* 12.4* 10.9*   HGB 13.1 10.7* 12.5  HCT 40.1 33.7* 37.8  PLT 181 194 174   Coag's No results for input(s): APTT, INR in the last 168 hours. BMET  Recent Labs Lab 07/23/15 0352 07/25/15 0423 07/26/15 0809  NA 142 140 137  K 4.1 4.0 3.6  CL 109 103 98*  CO2 24 30 32  BUN 20 23* 30*  CREATININE 0.80 0.74 0.82  GLUCOSE 151* 154* 191*   Electrolytes  Recent Labs Lab 07/23/15 0352 07/25/15 0423 07/25/15 1835 07/25/15 2230 07/26/15 0809  CALCIUM 8.5* 8.4*  --   --  7.9*  MG  --   --  2.3 2.3 2.5*  PHOS  --   --  2.4* 2.8 2.8   Sepsis Markers No results for input(s): LATICACIDVEN, PROCALCITON, O2SATVEN in the last 168 hours. ABG  Recent Labs Lab 07/21/15 0605  PHART 7.309*  PCO2ART 45.9*  PO2ART 372*   Liver Enzymes  Recent Labs Lab 07/25/15 0423  AST 35  ALT 92*  ALKPHOS 59  BILITOT 0.5  ALBUMIN 3.2*   Cardiac Enzymes No results for input(s): TROPONINI, PROBNP in the last 168 hours. Glucose  Recent Labs Lab 07/25/15 1544 07/25/15 2000 07/25/15 2339 07/26/15 0306 07/26/15 0751 07/26/15 1136  GLUCAP 134* 171* 164* 136* 188* 162*    Imaging Dg Chest Port 1 View  07/26/2015  CLINICAL DATA:  Follow-up pleural effusion, intubated patient, acute respiratory failure  EXAM: PORTABLE CHEST - 1 VIEW  COMPARISON:  Portable chest x-ray of July 25, 2015  FINDINGS: The lungs remain mildly hypoinflated. There is persistent right basilar atelectasis or pneumonia with small right pleural effusion. The heart and pulmonary vascularity are normal. The mediastinum is normal in width. The endotracheal tube tip lies 5 cm above the carina. The esophagogastric tube projects below the inferior margin of the image.  IMPRESSION: Persistent right basilar atelectasis -pneumonia with small right pleural effusion. The support apparatus are in appropriate position.   Electronically Signed   By: David  Martinique M.D.   On: 07/26/2015 07:13     Immune data Crp>>>0.5 C4>>> 36 c1 q  esterase>>> Ana>>> Esr>>>36 DSDNA>>>  ASSESSMENT / PLAN:  PULMONARY OETT 9/1 A: Angioedema- etiology unclear likely related to food allergen  R/o acquired c1 q esterase def or dysfxn -given empiric Berinert ( c1 inhibitor ) H/o asthma  RLL HCAP 9/5 P:   Maintain benadryl & pepcid & Decadron She is high risk death for transfer,  appreciate allergy consult,  Await level c1 esterase  Wean sbt , unable to extubate ENT called - When time for extubation, would extubate with ENT assistance Avoid self extubation, trach kit placed bedside  CARDIOVASCULAR CVL -none A:  MILD HTN P:  hydrallazine prn  RENAL A:   Normal renal FXN P:   Keep even balance Replete lytes as needed  GASTROINTESTINAL A:   Tolerating TF P:   Scheduled H2 blockade  TFs Need BM, add mirilax zofran especially if berinert used  HEMATOLOGIC A:   Mild anemia (normocytic/normochromic)- now some dilution likely (pos 2 lite overnight)_ No sig iron def Berinert can increase risk thrombosis P:  [place] heparin   INFECTIOUS / allergy A:  RLL HCAP -  9/5 resp >>staph >>   P:   HSV pending tongue  9/5 levofloxacin>>> 9/6 vanc >>   ENDOCRINE A:   On higher dose steroids P:   Trend glucose  ssi add with decadron  NEUROLOGIC A:   Pain Vent dyschrony and severe discomfort when awake per pt requests increased sedation P:   RASS goal: -2 to -3  PAD protocol  -propofol gtt (trig 85) WUA ok     FAMILY  - Updates: husband & sister at bedside -addressed all concerns- see separate discussion daily  - Inter-disciplinary family meet or Palliative Care meeting due by: 9/8  Summary - Angioedema , unclear etiology with persistent tongue swelling & RLL staph HCAP ? aspiration Would not recommend transfer with such high risk husband agrees  The patient is critically ill with multiple organ systems failure and requires high complexity decision making for assessment and support, frequent evaluation and  titration of therapies, application of advanced monitoring technologies and extensive interpretation of multiple databases. Critical Care Time devoted to patient care services described in this note independent of APP time is 35 minutes.    Kara Mead MD. Shade Flood. [facility] Pager [phone] If no response call [phone]

## 2015-07-26 NOTE — Progress Notes (Signed)
ANTIBIOTIC CONSULT NOTE   Pharmacy Consult for Levaquin, Vancomycin Indication: HCAP  Allergies  Allergen Reactions  . Cephalosporins     hives  . Sulfa Antibiotics Hives    Patient Measurements: Height:  (160 cm) Weight: 176 lb 5.9 oz (80 kg) IBW/kg (Calculated) : 52.4  Vital Signs: Temp: 100.4 F (38 C) (09/06 0900) Temp Source: Core (Comment) (09/06 0800) BP: 162/88 mmHg (09/06 0900) Pulse Rate: 86 (09/06 0900) Intake/Output from previous day: 09/05 0701 - 09/06 0700 In: 2346.3 [I.V.:901.3; NG/GT:1195; IV Piggyback:250] Out: 2925 [Urine:2925]  Labs:  Recent Labs  07/25/15 0423 07/26/15 0809  WBC 10.9*  --   HGB 12.5  --   PLT 174  --   CREATININE 0.74 0.82   Estimated Creatinine Clearance: 72.1 mL/min (by C-G formula based on Cr of 0.82). No results for input(s): VANCOTROUGH, VANCOPEAK, VANCORANDOM, GENTTROUGH, GENTPEAK, GENTRANDOM, TOBRATROUGH, TOBRAPEAK, TOBRARND, AMIKACINPEAK, AMIKACINTROU, AMIKACIN in the last 72 hours.   Assessment: Pt is 59 yo female admitted 9/1 with angioedema, leading to intubation.  CXR 9/5 shows development of small atelectasis in RLL and pharmacy consulted to start levaquin for HCAP on 9/5.  Sputum culture growing abundant Staph Aureus on 9/6.  Pharmacy is consulted to dose Vancomycin.  9/5 >> Levaquin >> 9/6 >> Vanc >>  Today, 07/26/2015:  Tm 101.5  WBC elevated (steroids)  CrCl ~ 74 ml/min  Goal of Therapy:  Vancomycin trough level: 15-20 mcg/ml Appropriate abx dosing, eradication of infection.   Plan:   Continue Levaquin  IV q24h  Vancomycin 1g IV q12h.  Measure Vanc trough at steady state.  Follow up renal fxn, culture results, and clinical course.  Lynann Beaver PharmD, BCPS Pager [phone] 07/26/2015 10:05 AM

## 2015-07-26 NOTE — Progress Notes (Signed)
PTnegative ETT cuff leak- MD aware.

## 2015-07-26 NOTE — Progress Notes (Signed)
Nutrition Follow-up  INTERVENTION:   Continue Vital High Protein @ 45 mL/hr with 100 mL free water Q6h. This regimen + Propofol infusion will provide 1545 kcal (98% of needs), 94.5 grams protein, and 1303 mL free water.  RD to continue to monitor  NUTRITION DIAGNOSIS:   Inadequate oral intake related to inability to eat as evidenced by NPO status.  Ongoing.  GOAL:   Patient will meet greater than or equal to 90% of their needs  Meeting.  MONITOR:   Vent status, Weight trends, Labs, I & O's  REASON FOR ASSESSMENT:   Consult Assessment of nutrition requirement/status (TF recommendations)  ASSESSMENT:   59 year old female w/ known h/o asthma and allergies. 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 She woke up her husband and they proceeded to the ER. By the time 40 minutes had gone by the swelling had progressed to the point she was having muffled speech and difficulty w/ saliva. She was electively intubated for airway protection.  Patient is currently intubated on ventilator support MV: 4.9 L/min Temp (24hrs), Avg:100.7 F (38.2 C), Min:100 F (37.8 C), Max:101.5 F (38.6 C)  Propofol: 17.6 ml/hr-> provides 465 fat kcal  Labs reviewed: CBGs:136-188 Elevated BUN Elevated Mg  Diet Order:  Diet NPO time specified  Skin:  Reviewed, no issues  Last BM:  PTA  Height:   Ht Readings from Last 1 Encounters:  07/21/15  (1.6 m)    Weight:   Wt Readings from Last 1 Encounters:  07/26/15 176 lb 5.9 oz (80 kg)    Ideal Body Weight:  52.27 kg (kg)  BMI:  Body mass index is 31.25 kg/(m^2).  Estimated Nutritional Needs:   Kcal:  1571  Protein:  115-125g  Fluid:  1.6L/day  EDUCATION NEEDS:   No education needs identified at this time  Tilda Franco, MS, RD, LDN Pager: [phone] After Hours Pager: [phone]

## 2015-07-26 NOTE — Progress Notes (Signed)
Date:  Sept.6, 2016 U.R. performed for needs and level of care. Will continue to follow for Case Management needs.  Saarah Dewing, RN, BSN, CCM   [phone] 

## 2015-07-26 NOTE — Consult Note (Signed)
ENT CONSULT:  Reason for Consult: Angioedema with airway obstruction Referring Physician: Critical care medicine  Kayla Henry is an 59 y.o. female.  HPI: The patient is a healthy 59 year old female with a history of allergies and asthma who was admitted on 9/1 with acute tongue and lip swelling consistent with angioedema. No known inciting event, the patient is not taking ACE inhibitor or ARB. No known history of food allergies, unusual oral intake or trauma to that region. She was admitted and underwent elective intubation for airway management.  Past Medical History  Diagnosis Date  . Asthma   . Allergy   . Diverticulosis   . RLS (restless legs syndrome)   . Chronic sinusitis     Past Surgical History  Procedure Laterality Date  . Nasal polyp surgery    . Colonoscopy      Dr. Benson Norway    No family history on file.  Social History:  reports that she has never smoked. She has never used smokeless tobacco. She reports that she drinks alcohol. She reports that she does not use illicit drugs.  Allergies:  Allergies  Allergen Reactions  . Cephalosporins     hives  . Sulfa Antibiotics Hives    Medications: I have reviewed the patient's current medications.  Results for orders placed or performed during the hospital encounter of 07/21/15 (from the past 48 hour(s))  Glucose, capillary     Status: Abnormal   Collection Time: 07/24/15  5:03 PM  Result Value Ref Range   Glucose-Capillary 123 (H) 65 - 99 mg/dL   Comment 1 Notify RN    Comment 2 Document in Chart   Sedimentation rate     Status: Abnormal   Collection Time: 07/24/15  6:35 PM  Result Value Ref Range   Sed Rate 36 (H) 0 - 22 mm/hr  Anti-DNA antibody, double-stranded     Status: None   Collection Time: 07/24/15  6:35 PM  Result Value Ref Range   ds DNA Ab <1 0 - 9 IU/mL    Comment: (NOTE)                                   Negative      <5                                   Equivocal  5 - 9              Positive      >9 Performed At: [facility] [place], [place] [id number] Lindon Romp MD VO:[id number]   Glucose, capillary     Status: Abnormal   Collection Time: 07/24/15  8:12 PM  Result Value Ref Range   Glucose-Capillary 146 (H) 65 - 99 mg/dL  Glucose, capillary     Status: Abnormal   Collection Time: 07/25/15 12:35 AM  Result Value Ref Range   Glucose-Capillary 147 (H) 65 - 99 mg/dL  Glucose, capillary     Status: Abnormal   Collection Time: 07/25/15  4:22 AM  Result Value Ref Range   Glucose-Capillary 134 (H) 65 - 99 mg/dL  Comprehensive metabolic panel     Status: Abnormal   Collection Time: 07/25/15  4:23 AM  Result Value Ref Range   Sodium 140 135 - 145 mmol/L   Potassium 4.0 3.5 - 5.1  mmol/L   Chloride 103 101 - 111 mmol/L   CO2 30 22 - 32 mmol/L   Glucose, Bld 154 (H) 65 - 99 mg/dL   BUN 23 (H) 6 - 20 mg/dL   Creatinine, Ser 0.74 0.44 - 1.00 mg/dL   Calcium 8.4 (L) 8.9 - 10.3 mg/dL   Total Protein 6.4 (L) 6.5 - 8.1 g/dL   Albumin 3.2 (L) 3.5 - 5.0 g/dL   AST 35 15 - 41 U/L   ALT 92 (H) 14 - 54 U/L   Alkaline Phosphatase 59 38 - 126 U/L   Total Bilirubin 0.5 0.3 - 1.2 mg/dL   GFR calc non Af Amer >60 >60 mL/min   GFR calc Af Amer >60 >60 mL/min    Comment: (NOTE) The eGFR has been calculated using the CKD EPI equation. This calculation has not been validated in all clinical situations. eGFR's persistently <60 mL/min signify possible Chronic Kidney Disease.    Anion gap 7 5 - 15  CBC with Differential/Platelet     Status: Abnormal   Collection Time: 07/25/15  4:23 AM  Result Value Ref Range   WBC 10.9 (H) 4.0 - 10.5 K/uL   RBC 4.34 3.87 - 5.11 MIL/uL   Hemoglobin 12.5 12.0 - 15.0 g/dL   HCT 37.8 36.0 - 46.0 %   MCV 87.1 78.0 - 100.0 fL   MCH 28.8 26.0 - 34.0 pg   MCHC 33.1 30.0 - 36.0 g/dL   RDW 15.7 (H) 11.5 - 15.5 %   Platelets 174 150 - 400 K/uL   Neutrophils Relative % 74 43 - 77 %   Neutro Abs 8.1 (H) 1.7 -  7.7 K/uL   Lymphocytes Relative 15 12 - 46 %   Lymphs Abs 1.6 0.7 - 4.0 K/uL   Monocytes Relative 11 3 - 12 %   Monocytes Absolute 1.1 (H) 0.1 - 1.0 K/uL   Eosinophils Relative 0 0 - 5 %   Eosinophils Absolute 0.0 0.0 - 0.7 K/uL   Basophils Relative 0 0 - 1 %   Basophils Absolute 0.0 0.0 - 0.1 K/uL  Glucose, capillary     Status: Abnormal   Collection Time: 07/25/15  7:46 AM  Result Value Ref Range   Glucose-Capillary 121 (H) 65 - 99 mg/dL   Comment 1 Notify RN    Comment 2 Document in Chart   Culture, respiratory (NON-Expectorated)     Status: None (Preliminary result)   Collection Time: 07/25/15 10:20 AM  Result Value Ref Range   Specimen Description TRACHEAL ASPIRATE    Special Requests NONE    Gram Stain      MODERATE WBC PRESENT,BOTH PMN AND MONONUCLEAR NO SQUAMOUS EPITHELIAL CELLS SEEN ABUNDANT GRAM POSITIVE COCCI IN CLUSTERS Performed at [facility] Note: RIFAMPIN AND GENTAMICIN SHOULD NOT BE USED AS SINGLE DRUGS FOR TREATMENT OF STAPH INFECTIONS. Performed at [facility]    Report Status PENDING   Glucose, capillary     Status: Abnormal   Collection Time: 07/25/15 11:42 AM  Result Value Ref Range   Glucose-Capillary 155 (H) 65 - 99 mg/dL   Comment 1 Notify RN    Comment 2 Document in Chart   Glucose, capillary     Status: Abnormal   Collection Time: 07/25/15  3:44 PM  Result Value Ref Range   Glucose-Capillary 134 (H) 65 - 99 mg/dL   Comment 1 Notify RN  Comment 2 Document in Chart   Phosphorus     Status: Abnormal   Collection Time: 07/25/15  6:35 PM  Result Value Ref Range   Phosphorus 2.4 (L) 2.5 - 4.6 mg/dL  Magnesium     Status: None   Collection Time: 07/25/15  6:35 PM  Result Value Ref Range   Magnesium 2.3 1.7 - 2.4 mg/dL  Glucose, capillary     Status: Abnormal   Collection Time: 07/25/15  8:00 PM  Result Value Ref Range   Glucose-Capillary 171 (H) 65 - 99 mg/dL   Comment 1  Notify RN   Phosphorus     Status: None   Collection Time: 07/25/15 10:30 PM  Result Value Ref Range   Phosphorus 2.8 2.5 - 4.6 mg/dL  Magnesium     Status: None   Collection Time: 07/25/15 10:30 PM  Result Value Ref Range   Magnesium 2.3 1.7 - 2.4 mg/dL  Glucose, capillary     Status: Abnormal   Collection Time: 07/25/15 11:39 PM  Result Value Ref Range   Glucose-Capillary 164 (H) 65 - 99 mg/dL   Comment 1 Notify RN   Glucose, capillary     Status: Abnormal   Collection Time: 07/26/15  3:06 AM  Result Value Ref Range   Glucose-Capillary 136 (H) 65 - 99 mg/dL   Comment 1 Notify RN   Glucose, capillary     Status: Abnormal   Collection Time: 07/26/15  7:51 AM  Result Value Ref Range   Glucose-Capillary 188 (H) 65 - 99 mg/dL   Comment 1 Notify RN    Comment 2 Document in Chart   Basic metabolic panel     Status: Abnormal   Collection Time: 07/26/15  8:09 AM  Result Value Ref Range   Sodium 137 135 - 145 mmol/L   Potassium 3.6 3.5 - 5.1 mmol/L   Chloride 98 (L) 101 - 111 mmol/L   CO2 32 22 - 32 mmol/L   Glucose, Bld 191 (H) 65 - 99 mg/dL   BUN 30 (H) 6 - 20 mg/dL   Creatinine, Ser 0.82 0.44 - 1.00 mg/dL   Calcium 7.9 (L) 8.9 - 10.3 mg/dL   GFR calc non Af Amer >60 >60 mL/min   GFR calc Af Amer >60 >60 mL/min    Comment: (NOTE) The eGFR has been calculated using the CKD EPI equation. This calculation has not been validated in all clinical situations. eGFR's persistently <60 mL/min signify possible Chronic Kidney Disease.    Anion gap 7 5 - 15  Phosphorus     Status: None   Collection Time: 07/26/15  8:09 AM  Result Value Ref Range   Phosphorus 2.8 2.5 - 4.6 mg/dL  Magnesium     Status: Abnormal   Collection Time: 07/26/15  8:09 AM  Result Value Ref Range   Magnesium 2.5 (H) 1.7 - 2.4 mg/dL  Glucose, capillary     Status: Abnormal   Collection Time: 07/26/15 11:36 AM  Result Value Ref Range   Glucose-Capillary 162 (H) 65 - 99 mg/dL   Comment 1 Notify RN    Comment  2 Document in Chart     Dg Chest Port 1 View  07/26/2015   CLINICAL DATA:  Follow-up pleural effusion, intubated patient, acute respiratory failure  EXAM: PORTABLE CHEST - 1 VIEW  COMPARISON:  Portable chest x-ray of July 25, 2015  FINDINGS: The lungs remain mildly hypoinflated. There is persistent right basilar atelectasis or pneumonia with small right pleural  effusion. The heart and pulmonary vascularity are normal. The mediastinum is normal in width. The endotracheal tube tip lies 5 cm above the carina. The esophagogastric tube projects below the inferior margin of the image.  IMPRESSION: Persistent right basilar atelectasis -pneumonia with small right pleural effusion. The support apparatus are in appropriate position.   Electronically Signed   By: Resean Brander  [place] M.D.   On: 07/26/2015 07:13   Dg Chest Port 1 View  07/25/2015   CLINICAL DATA:  59 year old female intubated patient. History of angioedema.  EXAM: PORTABLE CHEST - 1 VIEW  COMPARISON:  Chest x-ray 07/23/2015.  FINDINGS: An endotracheal tube is in place with tip 4.6 cm above the carina. A nasogastric tube is seen extending into the stomach, however, the tip of the nasogastric tube extends below the lower margin of the image. Lung volumes are low. Increasing small right pleural effusion with probable passive subsegmental atelectasis in the right lower lobe. Left lung is clear. Crowding of the pulmonary vasculature, accentuated by low lung volumes, without frank pulmonary edema. Heart size is normal. Mediastinal contours are within normal limits allowing for patient position.  IMPRESSION: 1. Support apparatus, as above. 2. Interval development of small right pleural effusion with passive subsegmental atelectasis in the right lower lobe.   Electronically Signed   By: Vinnie Langton M.D.   On: 07/25/2015 08:01    ROS:ROS 12 systems reviewed and negative except as stated in HPI   Blood pressure 108/55, pulse 68, temperature 100.9 F (38.3  C), temperature source Core (Comment), resp. rate 14, height _0  (1.6 m), weight 80 kg (176 lb 5.9 oz), SpO2 95 %.  PHYSICAL EXAM: General appearance - orotracheal intubation with appropriate sedation, patient response to verbal and physical stimuli. Nose - normal and patent, no erythema, discharge or polyps Mouth - Oral cavity shows significant anterior tongue swelling, soft to palpation without evidence of ulcer, mass or lesion. oral tracheal intubation in place.  Neck - supple, no significant adenopathy, no palpable salivary gland obstruction or mass, no erythema or tenderness, no evidence of infection.  FLEXIBLE LARYNGOSCOPY: 4 mm flexible laryngoscope passed through the left nasal passageway without difficulty, no evidence of purulent discharge, mass or polyp. Nasopharynx patent. Significant oropharyngeal secretions suctioned, moderate soft tissue edema involving the supraglottis and arytenoids. No evidence of mass, lesion or infection.  Studies Reviewed:CXR  Assessment/Plan: The patient is admitted with acute angioedema of the anterior airway of unknown etiology. Elective intubation to maintain airway patency and safety performed. Patient currently intubated and sedated. 5 days after initial presentation the patient continues to have anterior tongue swelling which is soft and by the family's report may be diminishing in size. There is expected supraglottic edema on laryngoscopy. Recommend continuing current medical therapy over the next several days, monitoring for decrease in tongue swelling and soft tissue edema. Continue daily endotracheal tube leak test, if stable and improving would consider extubation. If continued concerns regarding soft tissue edema and failed leak test I would recommend elective tracheostomy for airway safety and management which may performed early next week. Please reconsult if continued concerns regarding swelling without significant improvement. The above  recommendations and plan were discussed with the patient's brother and sister.  [place], Viktoria Gruetzmacher 07/26/2015, 4:24 PM

## 2015-07-27 ENCOUNTER — Inpatient Hospital Stay (HOSPITAL_COMMUNITY): Payer: BC Managed Care – PPO

## 2015-07-27 DIAGNOSIS — J15212 Pneumonia due to Methicillin resistant Staphylococcus aureus: Secondary | ICD-10-CM

## 2015-07-27 LAB — CBC
HCT: 37.9 % (ref 36.0–46.0)
Hemoglobin: 12.1 g/dL (ref 12.0–15.0)
MCH: 28.1 pg (ref 26.0–34.0)
MCHC: 31.9 g/dL (ref 30.0–36.0)
MCV: 87.9 fL (ref 78.0–100.0)
PLATELETS: 187 10*3/uL (ref 150–400)
RBC: 4.31 MIL/uL (ref 3.87–5.11)
RDW: 15.4 % (ref 11.5–15.5)
WBC: 10.5 10*3/uL (ref 4.0–10.5)

## 2015-07-27 LAB — TRIGLYCERIDES: TRIGLYCERIDES: 97 mg/dL (ref <150)

## 2015-07-27 LAB — GLUCOSE, CAPILLARY
GLUCOSE-CAPILLARY: 109 mg/dL — AB (ref 65–99)
GLUCOSE-CAPILLARY: 213 mg/dL — AB (ref 65–99)
GLUCOSE-CAPILLARY: 180 mg/dL — AB (ref 65–99)
Glucose-Capillary: 230 mg/dL — ABNORMAL HIGH (ref 65–99)
Glucose-Capillary: 156 mg/dL — ABNORMAL HIGH (ref 65–99)
Glucose-Capillary: 174 mg/dL — ABNORMAL HIGH (ref 65–99)

## 2015-07-27 LAB — BASIC METABOLIC PANEL
Anion gap: 7 (ref 5–15)
BUN: 28 mg/dL — AB (ref 6–20)
CO2: 32 mmol/L (ref 22–32)
CREATININE: 0.78 mg/dL (ref 0.44–1.00)
Calcium: 8.3 mg/dL — ABNORMAL LOW (ref 8.9–10.3)
Chloride: 100 mmol/L — ABNORMAL LOW (ref 101–111)
GFR calc Af Amer: >60 mL/min (ref >60)
Glucose, Bld: 180 mg/dL — ABNORMAL HIGH (ref 65–99)
Potassium: 3.9 mmol/L (ref 3.5–5.1)
SODIUM: 139 mmol/L (ref 135–145)

## 2015-07-27 LAB — CULTURE, RESPIRATORY

## 2015-07-27 LAB — ANTINUCLEAR ANTIBODIES, IFA: ANTINUCLEAR ANTIBODIES, IFA: NEGATIVE

## 2015-07-27 LAB — CULTURE, RESPIRATORY W GRAM STAIN

## 2015-07-27 LAB — MAGNESIUM: Magnesium: 2.7 mg/dL — ABNORMAL HIGH (ref 1.7–2.4)

## 2015-07-27 LAB — PHOSPHORUS: PHOSPHORUS: 2.8 mg/dL (ref 2.5–4.6)

## 2015-07-27 MED ORDER — ENOXAPARIN SODIUM 40 MG/0.4ML ~~LOC~~ SOLN
40.0000 mg | SUBCUTANEOUS | Status: DC
  Administered 2015-07-27: 40 mg via SUBCUTANEOUS
  Filled 2015-07-27 (×2): qty 0.4

## 2015-07-27 MED ORDER — GELCLAIR MT GEL
1.0000 | Freq: Three times a day (TID) | OROMUCOSAL | Status: DC | PRN
  Administered 2015-07-27: 1 via ORAL
  Filled 2015-07-27 (×2): qty 1

## 2015-07-27 MED ORDER — INSULIN ASPART 100 UNIT/ML ~~LOC~~ SOLN
0.0000 [IU] | SUBCUTANEOUS | Status: DC
  Administered 2015-07-27: 5 [IU] via SUBCUTANEOUS
  Administered 2015-07-27: 3 [IU] via SUBCUTANEOUS
  Administered 2015-07-27: 5 [IU] via SUBCUTANEOUS
  Administered 2015-07-27 – 2015-07-28 (×3): 3 [IU] via SUBCUTANEOUS
  Administered 2015-07-28: 2 [IU] via SUBCUTANEOUS
  Administered 2015-07-28: 3 [IU] via SUBCUTANEOUS

## 2015-07-27 MED ORDER — ACETAMINOPHEN 160 MG/5ML PO SOLN
650.0000 mg | Freq: Four times a day (QID) | ORAL | Status: DC | PRN
  Administered 2015-07-27 (×2): 650 mg
  Filled 2015-07-27 (×2): qty 20.3

## 2015-07-27 NOTE — Progress Notes (Signed)
PULMONARY / CRITICAL CARE MEDICINE   Name: Kayla Henry MRN: [id number] DOB: 1956/07/12    ADMISSION DATE:  07/21/2015 CONSULTATION DATE:   9/1  REFERRING MD :  Lita Mains   CHIEF COMPLAINT:   Angioedema   INITIAL PRESENTATION:  59 year old female w/ h/o allergies and asthma admitted early am hours of 9/1 w/ angioedema w/ airway compromise; etiology unclear on presentation.    STUDIES:   SIGNIFICANT EVENTS: 9/1 - intubated 9/3- sister requested 4 am, MD to bedside for further explanations if care plan 9/4- allergist consult appreciated 9/6 ENT consult - shoemaker  SUBJECTIVE:  Febrile Decreased  secretions from ETT Drooling Tongue remains swollen Weans well on PS 10/5  VITAL SIGNS: Temp:  [100 F (37.8 C)-101.8 F (38.8 C)] 100.2 F (37.9 C) (09/07 1000) Pulse Rate:  [54-80] 72 (09/07 1000) Resp:  [14-18] 15 (09/07 1000) BP: (83-169)/(45-93) 169/81 mmHg (09/07 1000) SpO2:  [90 %-100 %] 97 % (09/07 1000) FiO2 (%):  [30 %] 30 % (09/07 0938) Weight:  [171 lb 8.3 oz (77.8 kg)] 171 lb 8.3 oz (77.8 kg) (09/07 0443) HEMODYNAMICS:   VENTILATOR SETTINGS: Vent Mode:  [-] PRVC FiO2 (%):  [30 %] 30 % Set Rate:  [14 bmp] 14 bmp Vt Set:  [420 mL] 420 mL PEEP:  [5 cmH20] 5 cmH20 Plateau Pressure:  [16 cmH20-17 cmH20] 16 cmH20 INTAKE / OUTPUT:  Intake/Output Summary (Last 24 hours) at 07/27/15 1057 Last data filed at 07/27/15 1000  Gross per 24 hour  Intake 2959.37 ml  Output   2700 ml  Net 259.37 ml    PHYSICAL EXAMINATION: General:  Acutely ill,  Neuro:  rass 0, moves all , able to write HEENT:  Kayla Henry, orally intubated. Tongue is swollen less tense, no sig improvement in size Cardiovascular:  s1 s2 rrr Lungs:  Coarse BS rt base, no cuff leak Abdomen: soft, non-tender , no r/g, BS wnl Musculoskeletal:  Good tone, no foot drop  Skin:  Intact , no rash  LABS:  CBC  Recent Labs Lab 07/23/15 0352 07/25/15 0423 07/27/15 0559  WBC 12.4* 10.9* 10.5  HGB 10.7*  12.5 12.1  HCT 33.7* 37.8 37.9  PLT 194 174 187   Coag's No results for input(s): APTT, INR in the last 168 hours. BMET  Recent Labs Lab 07/25/15 0423 07/26/15 0809 07/27/15 0559  NA 140 137 139  K 4.0 3.6 3.9  CL 103 98* 100*  CO2 30 32 32  BUN 23* 30* 28*  CREATININE 0.74 0.82 0.78  GLUCOSE 154* 191* 180*   Electrolytes  Recent Labs Lab 07/25/15 0423  07/25/15 2230 07/26/15 0809 07/27/15 0559  CALCIUM 8.4*  --   --  7.9* 8.3*  MG  --   < > 2.3 2.5* 2.7*  PHOS  --   < > 2.8 2.8 2.8  < > = values in this interval not displayed. Sepsis Markers No results for input(s): LATICACIDVEN, PROCALCITON, O2SATVEN in the last 168 hours. ABG  Recent Labs Lab 07/21/15 0605  PHART 7.309*  PCO2ART 45.9*  PO2ART 372*   Liver Enzymes  Recent Labs Lab 07/25/15 0423  AST 35  ALT 92*  ALKPHOS 59  BILITOT 0.5  ALBUMIN 3.2*   Cardiac Enzymes No results for input(s): TROPONINI, PROBNP in the last 168 hours. Glucose  Recent Labs Lab 07/26/15 1136 07/26/15 1644 07/26/15 2024 07/26/15 2258 07/27/15 0403 07/27/15 0809  GLUCAP 162* 264* 166* 230* 109* 213*    Imaging Dg Chest  Port 1 View  07/27/2015   CLINICAL DATA:  Hypoxia  EXAM: PORTABLE CHEST - 1 VIEW  COMPARISON:  July 26, 2015  FINDINGS: Endotracheal tube tip is 4.9 cm above carina. Nasogastric tube tip and side port are below the diaphragm. No pneumothorax. There is layering effusion on the right with right base atelectasis. Lungs elsewhere clear. Heart size and pulmonary vascularity are normal. No adenopathy.  IMPRESSION: Right base atelectasis with effusion. No new opacity. No change in cardiac silhouette. Tube positions as described without pneumothorax.   Electronically Signed   By: Lowella Grip III M.D.   On: 07/27/2015 07:05     Immune data Crp>>>0.5 C4>>> 36 c1 q esterase>>> 35 nml Ana>>> Esr>>>36 DSDNA>>>neg  ASSESSMENT / PLAN:  PULMONARY OETT 9/1 A: Angioedema- etiology unclear  likely related to food allergen  R/o acquired c1 q esterase def or dysfxn -given empiric Berinert ( c1 inhibitor ) H/o asthma  RLL HCAP 9/5 P:   Maintain benadryl & pepcid & Decadron She is high risk death for transfer sbt , unable to extubate When time for extubation, would extubate with ENT assistance Avoid self extubation, trach kit placed bedside  CARDIOVASCULAR CVL -none A:  MILD HTN P:  hydrallazine prn  RENAL A:   Normal renal FXN P:   Keep even balance Replete lytes as needed  GASTROINTESTINAL A:   Tolerating TF P:   Scheduled H2 blockade  TFs   HEMATOLOGIC A:   Mild anemia (normocytic/normochromic)- now some dilution likely (pos 2 lite overnight)_ No sig iron def Berinert can increase risk thrombosis P:  [place] heparin   INFECTIOUS / allergy A:  RLL HCAP -  9/5 resp >>MRSA  P:   HSV pending tongue  9/5 levofloxacin>>> 9/7 9/6 vanc >>   ENDOCRINE A:   On high dose steroids P:   Trend glucose  ssi   NEUROLOGIC A:   Pain Vent dyschrony and severe discomfort when awake per pt requests increased sedation P:   RASS goal: -2  PAD protocol  -propofol gtt (trig 85) WUA ok    FAMILY  - Updates: husband at bedside daily-addressed all concerns  - Inter-disciplinary family meet or Palliative Care meeting due by:NA  Summary - Angioedema , unclear etiology with persistent tongue swelling & RLL MRSA HCAP ? aspiration If no improvement in next 1-2 days , may have to discuss tstomy  The patient is critically ill with multiple organ systems failure and requires high complexity decision making for assessment and support, frequent evaluation and titration of therapies, application of advanced monitoring technologies and extensive interpretation of multiple databases. Critical Care Time devoted to patient care services described in this note independent of APP time is 35 minutes.    Kara Mead MD. [place] Flood.  Pulmonary & Critical care Pager [phone] If no response call [phone]

## 2015-07-27 NOTE — Progress Notes (Signed)
[facility] Physician-Brief Progress Note Patient Name: Kayla Henry DOB: 11-27-1955 MRN: [id number]   Date of Service  07/27/2015  HPI/Events of Note  Hyperglycemia  eICU Interventions  Placed on q4 hour SSI - moderate scale     Intervention Category Intermediate Interventions: Hyperglycemia - evaluation and treatment  DETERDING,ELIZABETH 07/27/2015, 12:58 AM

## 2015-07-27 NOTE — Progress Notes (Signed)
   07/27/15 1100  Clinical Encounter Type  Visited With Patient  Visit Type Follow-up  Referral From Nurse  Consult/Referral To Chaplain  Spiritual Encounters  Spiritual Needs Other (Comment)   Chaplain visited with the patient per request by the Charge Nurse. The patient was asleep upon the Chaplains arrival. There were no family members present at the time.  The Chaplain will follow up with the patient and her family at a better time.

## 2015-07-27 NOTE — Plan of Care (Signed)
Problem: Phase I Progression Outcomes Goal: Tracheostomy by Vent Day 14 Outcome: Not Met (add Reason) Only day 6 on Vent

## 2015-07-27 NOTE — Progress Notes (Signed)
Pt ETT advanced from 19cm to 21cm per Dr Vassie Loll.  Cuff leak test X 2.  No audible leak but pt did react and VT was lower.Pt continues to wean on ps 10.

## 2015-07-28 ENCOUNTER — Inpatient Hospital Stay (HOSPITAL_COMMUNITY): Payer: BC Managed Care – PPO

## 2015-07-28 LAB — GLUCOSE, CAPILLARY
GLUCOSE-CAPILLARY: 190 mg/dL — AB (ref 65–99)
Glucose-Capillary: 164 mg/dL — ABNORMAL HIGH (ref 65–99)
Glucose-Capillary: 150 mg/dL — ABNORMAL HIGH (ref 65–99)
Glucose-Capillary: 137 mg/dL — ABNORMAL HIGH (ref 65–99)

## 2015-07-28 LAB — BASIC METABOLIC PANEL
Anion gap: 9 (ref 5–15)
BUN: 29 mg/dL — ABNORMAL HIGH (ref 6–20)
CHLORIDE: 102 mmol/L (ref 101–111)
CO2: 28 mmol/L (ref 22–32)
CREATININE: 0.77 mg/dL (ref 0.44–1.00)
Calcium: 8.1 mg/dL — ABNORMAL LOW (ref 8.9–10.3)
Glucose, Bld: 168 mg/dL — ABNORMAL HIGH (ref 65–99)
Potassium: 4.1 mmol/L (ref 3.5–5.1)
SODIUM: 139 mmol/L (ref 135–145)

## 2015-07-28 MED ORDER — ONDANSETRON HCL 4 MG/2ML IJ SOLN: 4.0000 mg | Freq: Four times a day (QID) | INTRAMUSCULAR | Status: DC | PRN

## 2015-07-28 MED ORDER — VITAL HIGH PROTEIN PO LIQD: ORAL | Status: DC

## 2015-07-28 MED ORDER — INSULIN ASPART 100 UNIT/ML ~~LOC~~ SOLN: 0.0000 [IU] | SUBCUTANEOUS | Status: DC

## 2015-07-28 MED ORDER — POLYETHYLENE GLYCOL 3350 17 G PO PACK: 17.0000 g | PACK | Freq: Every day | ORAL | Status: DC

## 2015-07-28 MED ORDER — IPRATROPIUM-ALBUTEROL 0.5-2.5 (3) MG/3ML IN SOLN: 3.0000 mL | RESPIRATORY_TRACT | Status: DC | PRN

## 2015-07-28 MED ORDER — BISACODYL 5 MG PO TBEC: 10.0000 mg | DELAYED_RELEASE_TABLET | Freq: Once | ORAL | Status: DC

## 2015-07-28 MED ORDER — ACETAMINOPHEN 160 MG/5ML PO SOLN: 650.0000 mg | Freq: Four times a day (QID) | ORAL | Status: DC | PRN

## 2015-07-28 MED ORDER — MONTELUKAST SODIUM 10 MG PO TABS: 10.0000 mg | ORAL_TABLET | Freq: Every day | ORAL | Status: AC

## 2015-07-28 MED ORDER — HYDRALAZINE HCL 20 MG/ML IJ SOLN: 10.0000 mg | Freq: Four times a day (QID) | INTRAMUSCULAR | Status: DC | PRN

## 2015-07-28 MED ORDER — GELCLAIR MT GEL: 1.0000 | Freq: Three times a day (TID) | OROMUCOSAL | Status: DC | PRN

## 2015-07-28 MED ORDER — HYDRALAZINE HCL 20 MG/ML IJ SOLN
10.0000 mg | Freq: Four times a day (QID) | INTRAMUSCULAR | Status: DC | PRN
Start: 2015-07-28 — End: 2015-08-23

## 2015-07-28 MED ORDER — DIPHENHYDRAMINE HCL 50 MG/ML IJ SOLN: 50.0000 mg | Freq: Four times a day (QID) | INTRAMUSCULAR | Status: DC

## 2015-07-28 MED ORDER — FAMOTIDINE IN NACL 20-0.9 MG/50ML-% IV SOLN: 20.0000 mg | Freq: Two times a day (BID) | INTRAVENOUS | Status: DC

## 2015-07-28 MED ORDER — METHYLPREDNISOLONE SODIUM SUCC 125 MG IJ SOLR: 60.0000 mg | Freq: Four times a day (QID) | INTRAMUSCULAR | Status: DC

## 2015-07-28 MED ORDER — ENOXAPARIN SODIUM 40 MG/0.4ML ~~LOC~~ SOLN: 40.0000 mg | SUBCUTANEOUS | Status: DC

## 2015-07-28 MED ORDER — SODIUM CHLORIDE 0.9 % IV SOLN: INTRAVENOUS | Status: DC

## 2015-07-28 MED ORDER — PROPOFOL 1000 MG/100ML IV EMUL
5.0000 ug/kg/min | INTRAVENOUS | Status: DC
Start: 2015-07-28 — End: 2015-08-23

## 2015-07-28 MED ORDER — PRO-STAT SUGAR FREE PO LIQD
30.0000 mL | Freq: Every day | ORAL | Status: DC
Start: 2015-07-28 — End: 2015-07-28

## 2015-07-28 MED ORDER — CHLORHEXIDINE GLUCONATE 0.12% ORAL RINSE (MEDLINE KIT): 15.0000 mL | Freq: Two times a day (BID) | OROMUCOSAL | Status: DC

## 2015-07-28 MED ORDER — LIP MEDEX EX OINT: TOPICAL_OINTMENT | CUTANEOUS | Status: DC | PRN

## 2015-07-28 MED ORDER — VANCOMYCIN HCL IN DEXTROSE 1-5 GM/200ML-% IV SOLN: 1000.0000 mg | Freq: Two times a day (BID) | INTRAVENOUS | Status: DC

## 2015-07-28 MED ORDER — FENTANYL CITRATE (PF) 100 MCG/2ML IJ SOLN
50.0000 ug | INTRAMUSCULAR | Status: DC | PRN
Start: 2015-07-28 — End: 2015-08-23

## 2015-07-28 NOTE — Progress Notes (Signed)
Nutrition Follow-up  INTERVENTION:   Add 30 ml Prostat daily Continue Vital High Protein @ 45 mL/hr with 100 mL free water Q6h. This regimen + Propofol infusion will provide 1470 kcal (91% of needs), 110 grams protein, and 1303 mL free water.  RD to continue to monitor  NUTRITION DIAGNOSIS:   Inadequate oral intake related to inability to eat as evidenced by NPO status.  Ongoing.  GOAL:   Patient will meet greater than or equal to 90% of their needs  Meeting.  MONITOR:   Vent status, Weight trends, Labs, I & O's  ASSESSMENT:   59 year old female w/ known h/o asthma and allergies. 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 She woke up her husband and they proceeded to the ER. By the time 40 minutes had gone by the swelling had progressed to the point she was having muffled speech and difficulty w/ saliva. She was electively intubated for airway protection.  Patient is currently intubated on ventilator support MV: 6.7 L/min Temp (24hrs), Avg:100.8 F (38.2 C), Min:100.4 F (38 C), Max:101.3 F (38.5 C)  Propofol: 11 ml/hr -> provides 290 fat kcal  Labs reviewed: CBGs: 150-190 Elevated Mg Phos WNL  Diet Order:  Diet NPO time specified  Skin:  Reviewed, no issues  Last BM:  9/8  Height:   Ht Readings from Last 1 Encounters:  07/21/15  (1.6 m)    Weight:   Wt Readings from Last 1 Encounters:  07/28/15 174 lb 13.2 oz (79.3 kg)    Ideal Body Weight:  52.27 kg (kg)  BMI:  Body mass index is 30.98 kg/(m^2).  Estimated Nutritional Needs:   Kcal:  1620  Protein:  110-120g  Fluid:  1.6L/day  EDUCATION NEEDS:   No education needs identified at this time  Tilda Franco, MS, RD, LDN Pager: [phone] After Hours Pager: [phone]

## 2015-07-28 NOTE — Progress Notes (Signed)
Pt transported to [facility] via [facility].  Pt continues to have mod amt of ET secretions as well as oral secretions.  Orvan Seen RN from [facility] had discussion with pt's brother regarding issues with loss of  airway and  Interventions up to opening airway/crich. Vital sgins stable and pt is comfortable with Diprivan infusing at .

## 2015-07-28 NOTE — Discharge Summary (Signed)
Physician Discharge Summary  Patient ID: Kayla Henry MRN: [id number] DOB/AGE: July 27, 1956 59 y.o.  Admit date: 07/21/2015 Discharge date: 07/28/2015    Discharge Diagnoses:  Angioedema  Asthma  RLL MRSA HCAP  Ventilator Dependent Respiratory Failure secondary to Upper Airway Obstruction  HTN Ventilator Associated Dysphagia / Malnutrition Risk  Anemia  Steroid Induced Hyperglycemia  Anxiety                                                                         DISCHARGE PLAN BY DIAGNOSIS     Angioedema  Asthma  RLL MRSA HCAP  Ventilator Dependent Respiratory Failure secondary to Upper Airway Obstruction   Discharge Plan: - Continue frequent oral care  - Benadryl 50 mg IV Q6 - Pepcid 20 mg IV BID  - Duoneb Q4 PRN wheezing / SOB - Singulair 10 mg PT QD - Solumedrol 60 mg IV Q6 - see autoimmune work up, intubation, allergy, ENT notes below  HTN  Discharge Plan: - PRN Hydralazine for SBP > 160 or DBP > 100   Ventilator Associated Dysphagia / At Risk Malnutrition    Discharge Plan: - Vital High Protein @ 45 mL/hr with 100 mL free water Q6h. This regimen + Propofol infusion will provide 1545 kcal (98% of needs), 94.5 grams protein, and 1303 mL free water. - Pepcid 20 mg BID for stress ulcer prophylaxis (+ angioedema) - PRN bowel regimen   Anemia   Discharge Plan: - Monitor CBC, no indication for transfusion at this time.  Transfuse per ICU guidelines  - Lovenox for DVT prophylaxis   Steroid Induced Hyperglycemia   Discharge Plan: - Continue sliding scale insulin, moderate scale.  No basal insulin.   Anxiety   Discharge Plan: Propofol for sedation Fentanyl for pain                   DISCHARGE SUMMARY   Kayla Henry is a 59 y.o. y/o female with a PMH of asthma, seasonal allergies, diverticulosis, restless leg syndrome and chronic sinusitis who presented to [facility] on 07/21/15 with complaints of tongue itching, swelling and shortness of  breath.  At  time of presentation, the patient reported that she woke 30 minutes prior to admission (0330) with sensation of tongue itching, swelling and shortness of breath. She took 2 Benadryl prior to presentation but unfortunately had no effect on symptoms. The patient denied new ingestions or exposures on presentation.  She has known drug allergies to cephalosporins and sulfa antibiotics.  Medication review reflects the patient is not on ACE-I or ARB prior to admission.    She presented to the ER at 0400, by the time 40 minutes had gone by the swelling had progressed to the point she was having muffled speech and difficulty w/ saliva.  Prior to intubation and on discussing this w/ her husband she had not recently started any new medication, new supplement, or cooking recipe out of the ordinary. She had eaten at home that evening. She had been in usual state of health prior to going to bed. 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  The patient had progressive swelling and decision was made for emergent intubation. She was intubated in the ER per anesthesia / ED attending. Notes reflect a difficult intubation that required placement of bougie through the vocal cords with placement of ET tube over bougie.  She was treated with IV steroid-induced, H1 / H2 blocker and leukotriene inhibitor. The patient was admitted to ICU for close observation. Propofol and fentanyl were utilized for sedation/pain.  She was assessed by Dr. Annamaria Boots (allergy/pulmonology) on 9/4. Bradykinin mediated-hereditary / C1 esterase type response ruled out (See labs below).  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  By 9/6, angioedema was minimally changed and ENT was consulted for airway evaluation.  ENT performed direct laryngoscopy which revealed no evidence of purulent discharge,  mass or polyp. Nasopharynx was patent. Significant oropharyngeal secretions suctioned, moderate soft tissue edema involving the supraglottis and arytenoids. No evidence of mass, lesion or infection. ENT recommended continuing current plan of care and monitoring for reduction of swelling. As well as daily endotracheal cuff leak test. Thus far, the patient has not had a positive cuff leak.  She remains medically stable on mechanical ventilation and has been cooperative with care.     There was early discussion regarding transfer to a tertiary facility at family's request (sister who is reportedly an Therapist, sports). At that time (9/5), it was felt that the risk/benefit ratio of transfer did not favor transport with potential loss of airway.  The sister was noted to have erratic behavior and angry outbursts with staff (she required escort off the unit).  Patient's husband Education officer, community) agreed that transport was not in her best interest at that time.  The family again requested transfer 9/8 and are willing to accept high risks of transfer (see signed transport form with detailed risks).                SIGNIFICANT EVENTS: 9/01  Admitted to [facility] with angioedema of unclear etiology, required intubation for airway obstruction  9/03  Sister requested 4 am, MD to bedside for further explanations of care plan 9/04  Allergist consulted 9/06  ENT consulted - Wilburn Cornelia  TUBES / LINES Oral ETT 9/1 >>   AUTOIMMUNE  CRP >> 0.5 C4 >> 36 C1 q esterase >> 35 nml ANA >> neg ESR >>36 DSDNA >> neg  MICRO DATA  9/01  MRSA PCR >> POSITIVE 9/05  Tracheal Aspirate >> abundant MRSA >> sensitive to vancomycin, bactrim, clinda, tetracycline  ANTIBIOTICS Vancomycin 9/6 (MRSA HCAP) >>   CONSULTS ENT - Dr. Jerrell Belfast.  Evaluation by flexible laryngoscopy on 9/6 >> 4 mm flexible laryngoscope passed through the left nasal passageway without difficulty, no evidence of purulent discharge, mass or polyp. Nasopharynx patent. Significant  oropharyngeal secretions suctioned, moderate soft tissue edema involving the supraglottis and arytenoids. No evidence of mass, lesion or infection.  INTUBATION NOTES (from ER documentation) Intubated per Anesthesia in the ER.   Laryngoscope size: Mac 4 Tube size: 7.5 mm Tube type: cuffed Number of attempts: 4 Comments: Considerable amount of edema around the vocal cords. CRNA attempted first to pass the ET tube and then again with bougie with no success.  Small amount of trauma with some bleeding. Readjusted blade and then was able to pass bougie through the cords and then fed ET tube over.  ALLERGY EVALUATION:  Angioedema - usual effort is to distinguish between mast cell mediated/ allergic, and bradykinin mediated- hereditary/ C1 esterase types. Other mechanisms  are not well understood.  So far, there is no evidence of generalized anaphylaxis or acute asthma. Her hx of long-standing recurrent hives, pruritus and lack of family hx favor this being mast cell/ allergy type. Treatment is based on H1 and H2 antihistamines, steroids, and airway support. She is now getting benadryl (H1), famotidine (H2), decadron, and singulair (leukotriene inhib).      Discharge Exam: General: Acutely ill adult female in NAD on vent Neuro: rass 0, moves all , able to write HEENT: normocephalic, OETT. Tongue is swollen, less tense, no significant improvement in size Cardiovascular: s1 s2 rrr Lungs: Coarse BS rt base, no cuff leak Abdomen: soft, non-tender , no r/g, BS wnl Musculoskeletal: Good tone, no foot drop  Skin: Intact , no rash  Filed Vitals:   07/28/15 0600 07/28/15 0751 07/28/15 0800 07/28/15 1000  BP: 143/78  123/71 132/75  Pulse: 68  65 63  Temp: 100.8 F (38.2 C)  100.4 F (38 C) 100.4 F (38 C)  TempSrc: Core (Comment) Core (Comment) Core (Comment)   Resp: 17  14 18   Height:      Weight:      SpO2: 93%  96% 94%     Discharge Labs  BMET  Recent Labs Lab 07/22/15 0415  07/23/15 0352 07/25/15 0423 07/25/15 1835 07/25/15 2230 07/26/15 0809 07/27/15 0559 07/28/15 0340  NA 139 142 140  --   --  137 139 139  K 4.5 4.1 4.0  --   --  3.6 3.9 4.1  CL 107 109 103  --   --  98* 100* 102  CO2 22 24 30   --   --  32 32 28  GLUCOSE 146* 151* 154*  --   --  191* 180* 168*  BUN 11 20 23*  --   --  30* 28* 29*  CREATININE 0.88 0.80 0.74  --   --  0.82 0.78 0.77  CALCIUM 8.8* 8.5* 8.4*  --   --  7.9* 8.3* 8.1*  MG 2.1  --   --  2.3 2.3 2.5* 2.7*  --   PHOS 3.7  --   --  2.4* 2.8 2.8 2.8  --     CBC  Recent Labs Lab 07/23/15 0352 07/25/15 0423 07/27/15 0559  HGB 10.7* 12.5 12.1  HCT 33.7* 37.8 37.9  WBC 12.4* 10.9* 10.5  PLT 194 174 187   CBG (last 3)   Recent Labs  07/27/15 2324 07/28/15 0351 07/28/15 0822  GLUCAP 190* 164* 150*          Follow-up Information    Follow up with Wyatt Haste, MD.   Specialty:  Family Medicine   Why:  Post hospital discharge   Contact information:   [place] [id number] [phone]         Medication List    STOP taking these medications        budesonide-formoterol 160-4.5 MCG/ACT inhaler  Commonly known as:  SYMBICORT     diphenhydrAMINE 25 MG tablet  Commonly known as:  SOMINEX     fluticasone 50 MCG/ACT nasal spray  Commonly known as:  FLONASE     gabapentin 100 MG capsule  Commonly known as:  NEURONTIN     ibuprofen 200 MG tablet  Commonly known as:  ADVIL,MOTRIN     meloxicam 15 MG tablet  Commonly known as:  MOBIC     rOPINIRole 1 MG tablet  Commonly known as:  REQUIP  TAKE these medications        acetaminophen 160 MG/5ML solution  Commonly known as:  TYLENOL  Place 20.3 mLs (650 mg total) into feeding tube every 6 (six) hours as needed for fever (temp > 101.5).     bisacodyl 5 MG EC tablet  Commonly known as:  DULCOLAX  Take 2 tablets (10 mg total) by mouth once.     chlorhexidine gluconate 0.12 % solution  Commonly known as:   PERIDEX  15 mLs by Mouth Rinse route 2 (two) times daily.     diphenhydrAMINE 50 MG/ML injection  Commonly known as:  BENADRYL  Inject 1 mL (50 mg total) into the vein every 6 (six) hours.     enoxaparin 40 MG/0.4ML injection  Commonly known as:  LOVENOX  Inject 0.4 mLs (40 mg total) into the skin daily.     famotidine 20-0.9 MG/50ML-%  Commonly known as:  PEPCID  Inject 50 mLs (20 mg total) into the vein every 12 (twelve) hours.     feeding supplement (VITAL HIGH PROTEIN) Liqd liquid  Rate - 45 ml/hr     fentaNYL 100 MCG/2ML injection  Commonly known as:  SUBLIMAZE  Inject 1-2 mLs (50-100 mcg total) into the vein every 2 (two) hours as needed (to maintain RASS goal, or pain).     hydrALAZINE 20 MG/ML injection  Commonly known as:  APRESOLINE  Inject 0.5-1 mLs (10-20 mg total) into the vein every 6 (six) hours as needed (SBP > 160 or DBP > 100).     insulin aspart 100 UNIT/ML injection  Commonly known as:  novoLOG  Inject 0-15 Units into the skin every 4 (four) hours.     ipratropium-albuterol 0.5-2.5 (3) MG/3ML Soln  Commonly known as:  DUONEB  Take 3 mLs by nebulization every 4 (four) hours as needed.     lip balm ointment  Apply topically as needed for lip care.     methylPREDNISolone sodium succinate 125 mg/2 mL injection  Commonly known as:  SOLU-MEDROL  Inject 0.96 mLs (60 mg total) into the vein every 6 (six) hours.     montelukast 10 MG tablet  Commonly known as:  SINGULAIR  Place 1 tablet (10 mg total) into feeding tube at bedtime.     mucosal barrier oral Gel  Take 1 packet by mouth 3 (three) times daily as needed for mouth pain.     ondansetron 4 MG/2ML Soln injection  Commonly known as:  ZOFRAN  Inject 2 mLs (4 mg total) into the vein every 6 (six) hours as needed for nausea or vomiting.     polyethylene glycol packet  Commonly known as:  MIRALAX / GLYCOLAX  Take 17 g by mouth daily.     propofol 1000 MG/100ML Emul injection  Commonly known as:   DIPRIVAN  Inject 367.5-5,880 mcg/min into the vein continuous.     sodium chloride 0.9 % infusion  10 ml/hr / KVO     vancomycin 1 GM/200ML Soln  Commonly known as:  VANCOCIN  Inject 200 mLs (1,000 mg total) into the vein every 12 (twelve) hours.         Disposition: [facility] ICU  Discharged Condition: Kayla Henry has met maximum benefit of inpatient care and is medically stable and cleared for discharge to [facility] at family request.  Patient is pending follow up as above.      Time spent on disposition:  Greater than 35 minutes.   Signed: Velna Hatchet  Alfredo Martinez NP-C [facility] Pgr: [phone]  07/28/2015, 11:54 AM

## 2015-07-28 NOTE — Progress Notes (Signed)
Report called to Rhett Bannister RN at [facility].

## 2015-07-28 NOTE — Progress Notes (Signed)
PULMONARY / CRITICAL CARE MEDICINE   Name: Kayla Henry MRN: [id number] DOB: 03/13/1956    ADMISSION DATE:  07/21/2015 CONSULTATION DATE:   9/1  REFERRING MD :  Lita Mains   CHIEF COMPLAINT:   Angioedema   INITIAL PRESENTATION:  59 year old female w/ h/o allergies and asthma admitted early am hours of 9/1 w/ angioedema w/ airway compromise; etiology unclear on presentation.    STUDIES:   SIGNIFICANT EVENTS: 9/1 - intubated 9/3- sister requested 4 am, MD to bedside for further explanations if care plan 9/4- allergist consult appreciated 9/6 ENT consult - shoemaker  SUBJECTIVE:  Febrile -lower grade Moderate secretions from ETT Tongue remains swollen Weans well on PS 8/5  VITAL SIGNS: Temp:  [100.2 F (37.9 C)-101.3 F (38.5 C)] 100.4 F (38 C) (09/08 0800) Pulse Rate:  [59-76] 65 (09/08 0800) Resp:  [14-23] 14 (09/08 0800) BP: (99-182)/(60-87) 123/71 mmHg (09/08 0800) SpO2:  [93 %-100 %] 96 % (09/08 0800) FiO2 (%):  [30 %] 30 % (09/08 0830) Weight:  [174 lb 13.2 oz (79.3 kg)] 174 lb 13.2 oz (79.3 kg) (09/08 0412) HEMODYNAMICS:   VENTILATOR SETTINGS: Vent Mode:  [-] CPAP FiO2 (%):  [30 %] 30 % Set Rate:  [14 bmp] 14 bmp Vt Set:  [420 mL] 420 mL PEEP:  [5 cmH20] 5 cmH20 Pressure Support:  [8 cmH20-10 cmH20] 8 cmH20 Plateau Pressure:  [16 cmH20-17 cmH20] 17 cmH20 INTAKE / OUTPUT:  Intake/Output Summary (Last 24 hours) at 07/28/15 0850 Last data filed at 07/28/15 0800  Gross per 24 hour  Intake 2361.47 ml  Output   1165 ml  Net 1196.47 ml    PHYSICAL EXAMINATION: General:  Acutely ill, oral ETT Neuro:  rass 0, moves all , able to write HEENT:  [place], orally intubated. Tongue is swollen less tense, no sig improvement in size Cardiovascular:  s1 s2 rrr Lungs:  Coarse BS rt base, no cuff leak Abdomen: soft, non-tender , no r/g, BS wnl Musculoskeletal:  Good tone, no foot drop  Skin:  Intact , no rash  LABS:  CBC  Recent Labs Lab 07/23/15 0352  07/25/15 0423 07/27/15 0559  WBC 12.4* 10.9* 10.5  HGB 10.7* 12.5 12.1  HCT 33.7* 37.8 37.9  PLT 194 174 187   Coag's No results for input(s): APTT, INR in the last 168 hours. BMET  Recent Labs Lab 07/26/15 0809 07/27/15 0559 07/28/15 0340  NA 137 139 139  K 3.6 3.9 4.1  CL 98* 100* 102  CO2 32 32 28  BUN 30* 28* 29*  CREATININE 0.82 0.78 0.77  GLUCOSE 191* 180* 168*   Electrolytes  Recent Labs Lab 07/25/15 2230 07/26/15 0809 07/27/15 0559 07/28/15 0340  CALCIUM  --  7.9* 8.3* 8.1*  MG 2.3 2.5* 2.7*  --   PHOS 2.8 2.8 2.8  --    Sepsis Markers No results for input(s): LATICACIDVEN, PROCALCITON, O2SATVEN in the last 168 hours. ABG No results for input(s): PHART, PCO2ART, PO2ART in the last 168 hours. Liver Enzymes  Recent Labs Lab 07/25/15 0423  AST 35  ALT 92*  ALKPHOS 59  BILITOT 0.5  ALBUMIN 3.2*   Cardiac Enzymes No results for input(s): TROPONINI, PROBNP in the last 168 hours. Glucose  Recent Labs Lab 07/27/15 1235 07/27/15 1652 07/27/15 2004 07/27/15 2324 07/28/15 0351 07/28/15 0822  GLUCAP 156* 180* 174* 190* 164* 150*    Imaging Dg Chest Port 1 View  07/28/2015   CLINICAL DATA:  Respiratory failure.  EXAM:  PORTABLE CHEST - 1 VIEW  COMPARISON:  07/27/2015.  FINDINGS: Endotracheal tube and NG tube in stable position. Heart size normal. Low lung volumes with persistent right base subsegmental atelectasis and/or infiltrate. Small right pleural effusion. No pneumothorax.  IMPRESSION: 1. Lines and tubes in stable position. 2. Persistent right base subsegmental atelectasis and/or for and small right pleural effusion.   Electronically Signed   By: Marcello Moores  Register   On: 07/28/2015 07:09     Immune data Crp>>>0.5 C4>>> 36 c1 q esterase>>> 35 nml Ana>>> neg Esr>>>36 DSDNA>>>neg  ASSESSMENT / PLAN:  PULMONARY OETT 9/1 A: Angioedema- etiology unclear likely related to food allergen  - c1 q esterase levels ok -given empiric Berinert ( c1  inhibitor ) H/o asthma  RLL MRSA HCAP 9/5 P:   Maintain benadryl & pepcid & steroids Explained high risk of transport Tolerates sbt  When time for extubation, would extubate with ENT assistance, otherwise plan for trach Avoid self extubation, trach kit placed bedside  CARDIOVASCULAR CVL -none A:  MILD HTN P:  hydrallazine prn Plan for PICC  RENAL A:   Normal renal FXN P:   Keep even balance Replete lytes as needed  GASTROINTESTINAL A:   Tolerating TF P:   Scheduled H2 blockade  TFs   HEMATOLOGIC A:   Mild anemia (normocytic/normochromic) No sig iron def Berinert can increase risk thrombosis P:  [place] heparin   INFECTIOUS / allergy A:  RLL HCAP -  9/5 resp >>MRSA  P:   HSV pending tongue  9/5 levofloxacin>>> 9/7 9/6 vanc >>   ENDOCRINE A:   On high dose steroids P:   Trend glucose  ssi   NEUROLOGIC A:   Pain Severe anxiety on WUA P:   RASS goal: -1 PAD protocol  -propofol gtt  WUA ok    FAMILY  - Updates: husband at bedside daily-addressed all concerns  - Inter-disciplinary family meet or Palliative Care meeting due by:NA  Summary - Angioedema , unclear etiology with persistent tongue swelling & RLL MRSA HCAP ? aspiration Since no improvement over 7 days , will initiate discussion re: tstomy She & husband requesting transfer  - will call [facility]  The patient is critically ill with multiple organ systems failure and requires high complexity decision making for assessment and support, frequent evaluation and titration of therapies, application of advanced monitoring technologies and extensive interpretation of multiple databases. Critical Care Time devoted to patient care services described in this note independent of APP time is 35 minutes.    Kara Mead MD. Shade Flood. [facility] Pager [phone] If no response call [phone]

## 2015-07-28 NOTE — Progress Notes (Signed)
[id number] Keshonda Monsour,RN,BSn,CCM: transferred to [facility] via care link

## 2015-08-08 ENCOUNTER — Telehealth: Payer: Self-pay | Admitting: Family Medicine

## 2015-08-08 NOTE — Telephone Encounter (Signed)
Pt called and made a hospital follow up appt. She was released from [facility] on Friday. I asked how she was doing and she stating ok just weak. Pt informed me that she had not had any medication changes. I asked if she had someone at home to help her and she stated that her husband is with her. She also stated that she had all her medications at home and didn't need anything. Pt made an appt for Wednesday and was informed that if she needed anything between now than to call the office.

## 2015-08-10 ENCOUNTER — Encounter: Payer: Self-pay | Admitting: Family Medicine

## 2015-08-10 ENCOUNTER — Ambulatory Visit (INDEPENDENT_AMBULATORY_CARE_PROVIDER_SITE_OTHER): Payer: BC Managed Care – PPO | Admitting: Family Medicine

## 2015-08-10 VITALS — BP 110/70 | HR 76 | Wt 158.0 lb

## 2015-08-10 DIAGNOSIS — J9601 Acute respiratory failure with hypoxia: Secondary | ICD-10-CM

## 2015-08-10 DIAGNOSIS — T783XXA Angioneurotic edema, initial encounter: Secondary | ICD-10-CM | POA: Diagnosis not present

## 2015-08-10 DIAGNOSIS — J45909 Unspecified asthma, uncomplicated: Secondary | ICD-10-CM

## 2015-08-10 DIAGNOSIS — G2581 Restless legs syndrome: Secondary | ICD-10-CM

## 2015-08-10 DIAGNOSIS — J3089 Other allergic rhinitis: Secondary | ICD-10-CM

## 2015-08-10 DIAGNOSIS — Z23 Encounter for immunization: Secondary | ICD-10-CM

## 2015-08-10 DIAGNOSIS — Z87898 Personal history of other specified conditions: Secondary | ICD-10-CM

## 2015-08-10 DIAGNOSIS — M1990 Unspecified osteoarthritis, unspecified site: Secondary | ICD-10-CM | POA: Diagnosis not present

## 2015-08-10 NOTE — Patient Instructions (Signed)
May return to school for half a day for 3 or 4 days then call me. Listen to your body!!!

## 2015-08-10 NOTE — Progress Notes (Signed)
   Subjective:    Patient ID: Kayla Henry, female    DOB: February 10, 1956, 59 y.o.   MRN: [id number]  HPI She is here for a transition of care visit. She was contacted upon discharge from the hospital and that note is present in the chart. She was initially admitted to the hospital in early September due to angioedema and then respiratory failure. She was admitted to the intensive care unit. She subsequently developed MRSA pneumonitis and was treated for that as well. She was seen by pulmonary as well as by allergy/immunology/ENT. The exact etiology of the angioedema remained unclear. She was then transferred to [facility] for further care since she was essentially making no progress. This was at the request of the family.She remained intubated while at [facility] but was eventually extubated. She was sent home last Friday. She was evaluated today with a swallowing study and is now able to take in solids. Prior to this she was having a great deal difficulty with hoarse voice as well as a history consistent with possible aspiration. Presently she is having no chest pain, shortness of breath. Her medications were reviewed as well as x-ray and lab data. Presently she is taking Symbicort, Flonase, Mobic, Singulair and Requip. Presently she is not on gabapentin. Also she is not taking Benadryl. She does feel weak and is interested in returning to work. Hospital discharge summary from [facility] was reviewed with information noted as above.  Review of Systems     Objective:   Physical Exam Alert and in no distress.Her voice is hoarse.Neck exam shows regular rhythm without murmurs or gallops. Lungs are clear to auscultation. Throat exam shows no lesions. Neck is supple without adenopathy.       Assessment & Plan:  History of angioedema - Plan: Ambulatory referral to Allergy  Need for prophylactic vaccination and inoculation against influenza - Plan: Flu Vaccine QUAD 36+ mos IM  RLS  (restless legs syndrome)  Intrinsic asthma, unspecified asthma severity, uncomplicated  Arthritis  Angioedema, initial encounter  Other allergic rhinitis  Acute respiratory failure with hypoxia  Presently she seems to be doing quite well other than fatigue. She would like to return to work. She is to go back for half days for 3 days and then call me. Strongly encouraged her to listen to her body not her mind. She will continue on her present medications. I will set her up to see allergy for follow-up on the angioedema since there was no clear etiology behind this.Over 40 minutes spent discussing all these issues with the patient and her husband.

## 2015-08-15 ENCOUNTER — Other Ambulatory Visit: Payer: Self-pay | Admitting: Family Medicine

## 2015-08-16 NOTE — Telephone Encounter (Signed)
IS THIS OKAY 

## 2015-08-23 ENCOUNTER — Ambulatory Visit (INDEPENDENT_AMBULATORY_CARE_PROVIDER_SITE_OTHER): Payer: BC Managed Care – PPO | Admitting: Family Medicine

## 2015-08-23 ENCOUNTER — Encounter: Payer: Self-pay | Admitting: Family Medicine

## 2015-08-23 VITALS — BP 140/82 | HR 73 | Ht 63.0 in | Wt 164.0 lb

## 2015-08-23 DIAGNOSIS — J208 Acute bronchitis due to other specified organisms: Secondary | ICD-10-CM

## 2015-08-23 DIAGNOSIS — R609 Edema, unspecified: Secondary | ICD-10-CM | POA: Diagnosis not present

## 2015-08-23 MED ORDER — AMOXICILLIN-POT CLAVULANATE 875-125 MG PO TABS: 1.0000 | ORAL_TABLET | Freq: Two times a day (BID) | ORAL | Status: AC

## 2015-08-23 NOTE — Progress Notes (Signed)
   Subjective:    Patient ID: Kayla Henry, female    DOB: 08-29-1956, 59 y.o.   MRN: [id number]  HPI  she has a 4 day history this started with postnasal drainage and a cough that is occasionally positive for yellowish sputum. This is especially worse in the evening. No fever, chills, earache, shortness of breath. She continues on her asthma medications. She does have a previous history of angioedema. She is scheduled for follow-up pulmonary and CT at [facility] on the 28th of this month. She also complains of ankle edema over the last several days. She notes swelling and no other areas of her body and it is slightly less in the morning. She is now back to working full time.   Review of Systems     Objective:   Physical Exam Alert and in no distress. Tympanic membranes and canals are normal. Pharyngeal area is normal. Neck is supple without adenopathy or thyromegaly. Cardiac exam shows a regular sinus rhythm without murmurs or gallops. Lungs are clear to auscultation.  exam of her lower extremities shows good pulses. Skin is normal. 1+ edema is noted.       Assessment & Plan:  Acute bronchitis due to other specified organisms - Plan: amoxicillin-clavulanate (AUGMENTIN) 875-125 MG tablet  Dependent edema  explained that this could easily be viral and recommended she wait before starting anabiotic. She has good understanding of this idea. Also explained that the edema is really nothing to worry about. She is comfortable with this approach.

## 2015-09-07 ENCOUNTER — Other Ambulatory Visit: Payer: Self-pay | Admitting: Family Medicine

## 2015-09-15 ENCOUNTER — Telehealth: Payer: Self-pay | Admitting: Family Medicine

## 2015-09-15 ENCOUNTER — Other Ambulatory Visit: Payer: BC Managed Care – PPO

## 2015-09-15 DIAGNOSIS — Z13220 Encounter for screening for lipoid disorders: Secondary | ICD-10-CM

## 2015-09-15 LAB — LIPID PANEL
CHOL/HDL RATIO: 2.8 ratio (ref <=5.0)
Cholesterol: 242 mg/dL — ABNORMAL HIGH (ref 125–200)
HDL: 87 mg/dL (ref >=46)
LDL CALC: 139 mg/dL — AB (ref <130)
Triglycerides: 82 mg/dL (ref <150)
VLDL: 16 mg/dL (ref <30)

## 2015-09-15 NOTE — Telephone Encounter (Signed)
Pt informed ok per [employer]

## 2015-09-15 NOTE — Telephone Encounter (Signed)
Pt needs cholesterol panel tested for insurance purposes.  Can she come in this morning and have it drawn? If so please put in orders

## 2015-11-01 ENCOUNTER — Telehealth: Payer: Self-pay

## 2015-11-01 NOTE — Telephone Encounter (Signed)
[facility]  Fax# [phone]

## 2016-02-11 ENCOUNTER — Other Ambulatory Visit: Payer: Self-pay | Admitting: Family Medicine

## 2016-04-14 ENCOUNTER — Other Ambulatory Visit: Payer: Self-pay | Admitting: Family Medicine

## 2016-05-18 ENCOUNTER — Encounter (HOSPITAL_COMMUNITY): Payer: Self-pay | Admitting: Emergency Medicine

## 2016-05-18 ENCOUNTER — Emergency Department (HOSPITAL_COMMUNITY)
Admission: EM | Admit: 2016-05-18 | Discharge: 2016-05-18 | Disposition: A | Discharge status: Home / Self Care | Payer: BC Managed Care – PPO | Attending: Emergency Medicine | Admitting: Emergency Medicine

## 2016-05-18 DIAGNOSIS — T783XXA Angioneurotic edema, initial encounter: Secondary | ICD-10-CM | POA: Insufficient documentation

## 2016-05-18 DIAGNOSIS — Z5321 Procedure and treatment not carried out due to patient leaving prior to being seen by health care provider: Secondary | ICD-10-CM | POA: Diagnosis not present

## 2016-05-18 DIAGNOSIS — J45909 Unspecified asthma, uncomplicated: Secondary | ICD-10-CM | POA: Insufficient documentation

## 2016-05-18 NOTE — ED Notes (Signed)
Called [facility] to inform them pt needs to go to room that just opened on [address].  Tech first states that pt left and states she is going to another hospital.

## 2016-05-18 NOTE — ED Notes (Signed)
C/o angioedema that started approx 1 1/2 hours ago.  States tongue is burning.  History of same with unknown cause.  Denies sob.  Speaking in complete sentences.

## 2016-05-18 NOTE — ED Notes (Signed)
Called pt's cell phone number and informed her they are cleaning her room and asked if she left.  Pt states she left because [facility].  Encouraged pt to wait here to be seen and explained again that she was getting ready to go to treatment room.  Pt states, ''That is ok.  [facility]."

## 2016-06-27 DIAGNOSIS — L501 Idiopathic urticaria: Secondary | ICD-10-CM | POA: Insufficient documentation

## 2016-09-15 ENCOUNTER — Other Ambulatory Visit: Payer: Self-pay | Admitting: Family Medicine

## 2016-09-15 DIAGNOSIS — J3089 Other allergic rhinitis: Secondary | ICD-10-CM

## 2016-10-21 ENCOUNTER — Other Ambulatory Visit: Payer: Self-pay | Admitting: Family Medicine

## 2016-10-22 NOTE — Telephone Encounter (Signed)
Is this okay to refill? 

## 2017-11-07 ENCOUNTER — Other Ambulatory Visit: Payer: Self-pay | Admitting: Family Medicine

## 2017-11-18 ENCOUNTER — Other Ambulatory Visit: Payer: Self-pay | Admitting: Family Medicine

## 2018-02-03 ENCOUNTER — Other Ambulatory Visit: Payer: Self-pay | Admitting: Internal Medicine

## 2018-02-03 DIAGNOSIS — Z1231 Encounter for screening mammogram for malignant neoplasm of breast: Secondary | ICD-10-CM

## 2018-02-24 ENCOUNTER — Ambulatory Visit
Admission: RE | Admit: 2018-02-24 | Discharge: 2018-02-24 | Disposition: A | Discharge status: Home / Self Care | Payer: BC Managed Care – PPO | Source: Ambulatory Visit | Attending: Internal Medicine | Admitting: Internal Medicine

## 2018-02-24 DIAGNOSIS — Z1231 Encounter for screening mammogram for malignant neoplasm of breast: Secondary | ICD-10-CM

## 2018-06-23 ENCOUNTER — Other Ambulatory Visit: Payer: Self-pay | Admitting: Internal Medicine

## 2018-06-23 ENCOUNTER — Ambulatory Visit
Admission: RE | Admit: 2018-06-23 | Discharge: 2018-06-23 | Disposition: A | Discharge status: Home / Self Care | Payer: BC Managed Care – PPO | Source: Ambulatory Visit | Attending: Internal Medicine | Admitting: Internal Medicine

## 2018-06-23 DIAGNOSIS — M5431 Sciatica, right side: Secondary | ICD-10-CM

## 2019-08-06 ENCOUNTER — Other Ambulatory Visit: Payer: Self-pay | Admitting: Internal Medicine

## 2019-08-06 DIAGNOSIS — Z1231 Encounter for screening mammogram for malignant neoplasm of breast: Secondary | ICD-10-CM

## 2019-08-06 DIAGNOSIS — E2839 Other primary ovarian failure: Secondary | ICD-10-CM

## 2019-10-22 ENCOUNTER — Other Ambulatory Visit: Payer: Self-pay

## 2019-10-22 ENCOUNTER — Ambulatory Visit
Admission: RE | Admit: 2019-10-22 | Discharge: 2019-10-22 | Disposition: A | Discharge status: Home / Self Care | Payer: BC Managed Care – PPO | Source: Ambulatory Visit | Attending: Internal Medicine | Admitting: Internal Medicine

## 2019-10-22 DIAGNOSIS — E2839 Other primary ovarian failure: Secondary | ICD-10-CM

## 2019-10-22 DIAGNOSIS — Z1231 Encounter for screening mammogram for malignant neoplasm of breast: Secondary | ICD-10-CM

## 2020-01-04 ENCOUNTER — Ambulatory Visit: Payer: BC Managed Care – PPO

## 2020-03-09 ENCOUNTER — Other Ambulatory Visit (HOSPITAL_COMMUNITY)
Admission: RE | Admit: 2020-03-09 | Discharge: 2020-03-09 | Disposition: A | Discharge status: Home / Self Care | Payer: Medicare Other | Source: Ambulatory Visit | Attending: Internal Medicine | Admitting: Internal Medicine

## 2020-03-09 ENCOUNTER — Other Ambulatory Visit: Payer: Self-pay | Admitting: Internal Medicine

## 2020-03-09 DIAGNOSIS — Z124 Encounter for screening for malignant neoplasm of cervix: Secondary | ICD-10-CM | POA: Diagnosis present

## 2020-03-11 LAB — CYTOLOGY - PAP: Diagnosis: NEGATIVE

## 2020-09-22 ENCOUNTER — Other Ambulatory Visit: Payer: Self-pay | Admitting: Internal Medicine

## 2020-09-22 DIAGNOSIS — Z1231 Encounter for screening mammogram for malignant neoplasm of breast: Secondary | ICD-10-CM

## 2020-10-07 ENCOUNTER — Other Ambulatory Visit: Payer: Self-pay | Admitting: Internal Medicine

## 2020-10-07 DIAGNOSIS — K862 Cyst of pancreas: Secondary | ICD-10-CM

## 2020-10-31 ENCOUNTER — Ambulatory Visit
Admission: RE | Admit: 2020-10-31 | Discharge: 2020-10-31 | Disposition: A | Discharge status: Home / Self Care | Payer: Medicare Other | Source: Ambulatory Visit | Attending: Internal Medicine | Admitting: Internal Medicine

## 2020-10-31 ENCOUNTER — Other Ambulatory Visit: Payer: Self-pay

## 2020-10-31 DIAGNOSIS — K862 Cyst of pancreas: Secondary | ICD-10-CM

## 2020-10-31 MED ORDER — GADOBENATE DIMEGLUMINE 529 MG/ML IV SOLN
15.0000 mL | Freq: Once | INTRAVENOUS | Status: AC | PRN
  Administered 2020-10-31: 15 mL via INTRAVENOUS

## 2020-11-01 ENCOUNTER — Ambulatory Visit
Admission: RE | Admit: 2020-11-01 | Discharge: 2020-11-01 | Disposition: A | Discharge status: Home / Self Care | Payer: BC Managed Care – PPO | Source: Ambulatory Visit | Attending: Internal Medicine | Admitting: Internal Medicine

## 2020-11-01 DIAGNOSIS — Z1231 Encounter for screening mammogram for malignant neoplasm of breast: Secondary | ICD-10-CM

## 2021-04-18 DIAGNOSIS — J45901 Unspecified asthma with (acute) exacerbation: Secondary | ICD-10-CM | POA: Diagnosis not present

## 2021-04-18 DIAGNOSIS — Z87898 Personal history of other specified conditions: Secondary | ICD-10-CM | POA: Diagnosis not present

## 2021-05-04 DIAGNOSIS — L501 Idiopathic urticaria: Secondary | ICD-10-CM | POA: Diagnosis not present

## 2021-07-06 DIAGNOSIS — L501 Idiopathic urticaria: Secondary | ICD-10-CM | POA: Diagnosis not present

## 2021-08-16 DIAGNOSIS — J4520 Mild intermittent asthma, uncomplicated: Secondary | ICD-10-CM | POA: Diagnosis not present

## 2021-08-16 DIAGNOSIS — E785 Hyperlipidemia, unspecified: Secondary | ICD-10-CM | POA: Diagnosis not present

## 2021-08-16 DIAGNOSIS — Z23 Encounter for immunization: Secondary | ICD-10-CM | POA: Diagnosis not present

## 2021-08-16 DIAGNOSIS — F5101 Primary insomnia: Secondary | ICD-10-CM | POA: Diagnosis not present

## 2021-08-16 DIAGNOSIS — Z87898 Personal history of other specified conditions: Secondary | ICD-10-CM | POA: Diagnosis not present

## 2021-08-31 DIAGNOSIS — L501 Idiopathic urticaria: Secondary | ICD-10-CM | POA: Diagnosis not present

## 2021-09-27 ENCOUNTER — Other Ambulatory Visit: Payer: Self-pay | Admitting: Internal Medicine

## 2021-09-27 DIAGNOSIS — Z1231 Encounter for screening mammogram for malignant neoplasm of breast: Secondary | ICD-10-CM

## 2021-10-31 DIAGNOSIS — L501 Idiopathic urticaria: Secondary | ICD-10-CM | POA: Diagnosis not present

## 2021-11-02 ENCOUNTER — Ambulatory Visit
Admission: RE | Admit: 2021-11-02 | Discharge: 2021-11-02 | Disposition: A | Discharge status: Home / Self Care | Payer: Medicare PPO | Source: Ambulatory Visit | Attending: Internal Medicine | Admitting: Internal Medicine

## 2021-11-02 DIAGNOSIS — Z1231 Encounter for screening mammogram for malignant neoplasm of breast: Secondary | ICD-10-CM | POA: Diagnosis not present

## 2021-11-28 DIAGNOSIS — Z23 Encounter for immunization: Secondary | ICD-10-CM | POA: Diagnosis not present

## 2021-11-28 DIAGNOSIS — E785 Hyperlipidemia, unspecified: Secondary | ICD-10-CM | POA: Diagnosis not present

## 2021-11-28 DIAGNOSIS — Z7185 Encounter for immunization safety counseling: Secondary | ICD-10-CM | POA: Diagnosis not present

## 2021-11-28 DIAGNOSIS — J4520 Mild intermittent asthma, uncomplicated: Secondary | ICD-10-CM | POA: Diagnosis not present

## 2021-12-01 DIAGNOSIS — L03113 Cellulitis of right upper limb: Secondary | ICD-10-CM | POA: Diagnosis not present

## 2021-12-19 DIAGNOSIS — L501 Idiopathic urticaria: Secondary | ICD-10-CM | POA: Diagnosis not present

## 2022-02-01 DIAGNOSIS — L501 Idiopathic urticaria: Secondary | ICD-10-CM | POA: Diagnosis not present

## 2022-02-01 DIAGNOSIS — J310 Chronic rhinitis: Secondary | ICD-10-CM | POA: Diagnosis not present

## 2022-02-01 DIAGNOSIS — B369 Superficial mycosis, unspecified: Secondary | ICD-10-CM | POA: Diagnosis not present

## 2022-02-01 DIAGNOSIS — J4530 Mild persistent asthma, uncomplicated: Secondary | ICD-10-CM | POA: Diagnosis not present

## 2022-02-28 DIAGNOSIS — J4520 Mild intermittent asthma, uncomplicated: Secondary | ICD-10-CM | POA: Diagnosis not present

## 2022-02-28 DIAGNOSIS — E785 Hyperlipidemia, unspecified: Secondary | ICD-10-CM | POA: Diagnosis not present

## 2022-02-28 DIAGNOSIS — Z1389 Encounter for screening for other disorder: Secondary | ICD-10-CM | POA: Diagnosis not present

## 2022-02-28 DIAGNOSIS — F5101 Primary insomnia: Secondary | ICD-10-CM | POA: Diagnosis not present

## 2022-02-28 DIAGNOSIS — Z87898 Personal history of other specified conditions: Secondary | ICD-10-CM | POA: Diagnosis not present

## 2022-02-28 DIAGNOSIS — Z1159 Encounter for screening for other viral diseases: Secondary | ICD-10-CM | POA: Diagnosis not present

## 2022-02-28 DIAGNOSIS — Z0000 Encounter for general adult medical examination without abnormal findings: Secondary | ICD-10-CM | POA: Diagnosis not present

## 2022-03-15 DIAGNOSIS — L501 Idiopathic urticaria: Secondary | ICD-10-CM | POA: Diagnosis not present

## 2022-03-24 IMAGING — MG MM DIGITAL SCREENING BILAT W/ TOMO AND CAD
8 series · 8 of 24 positions shown · non-contrast
Comparison: Previous exam(s).

CLINICAL DATA: Screening.

EXAM:
DIGITAL SCREENING BILATERAL MAMMOGRAM WITH TOMOSYNTHESIS AND CAD
TECHNIQUE: Bilateral screening digital craniocaudal and mediolateral oblique
mammograms were obtained. Bilateral screening digital breast
tomosynthesis was performed. The images were evaluated with
computer-aided detection.

[R CC synth-2D]
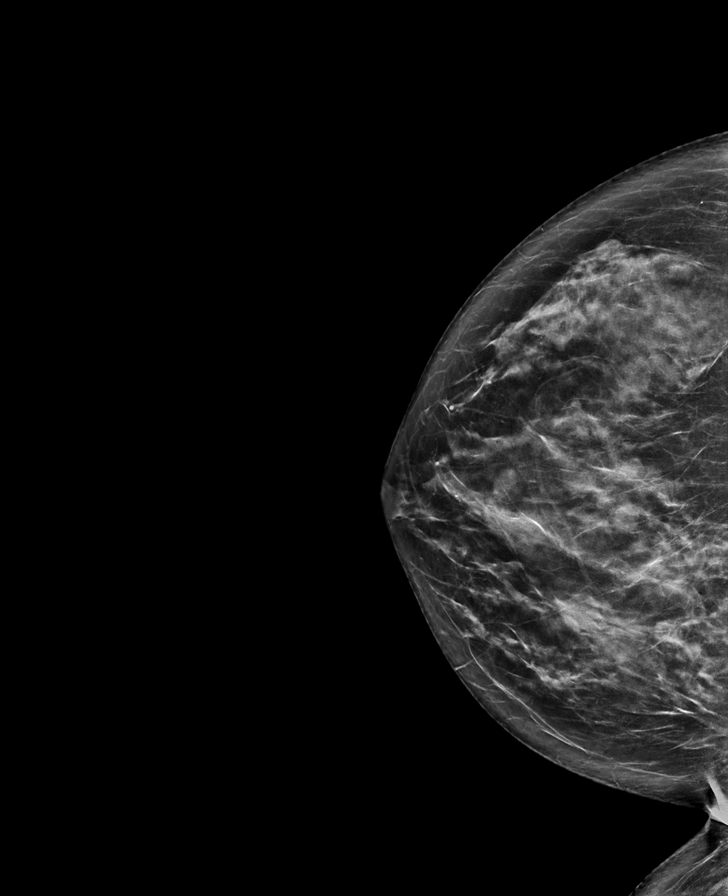

[R MLO synth-2D]
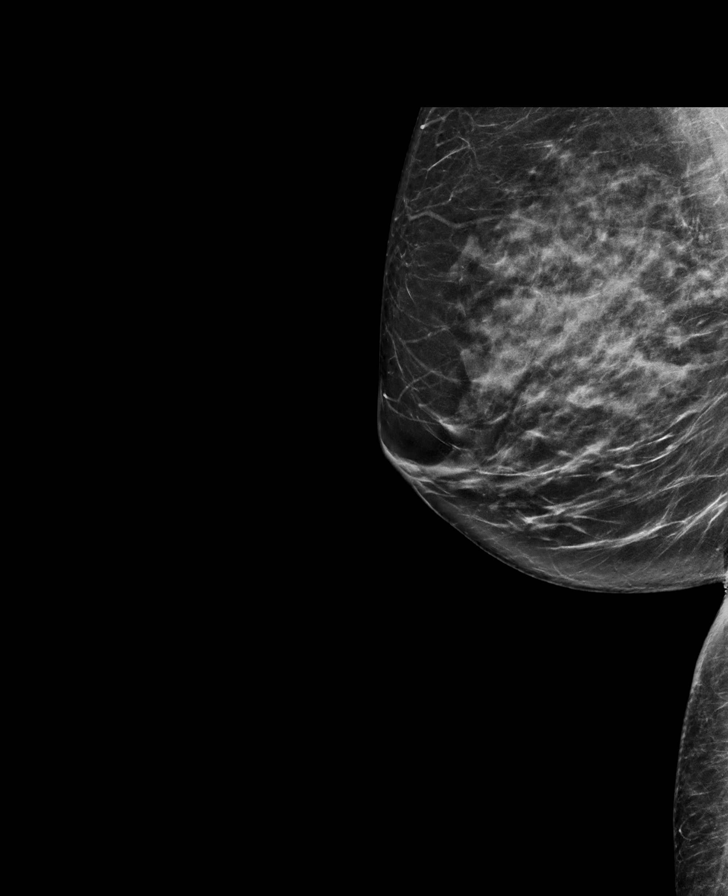

[L MLO synth-2D]
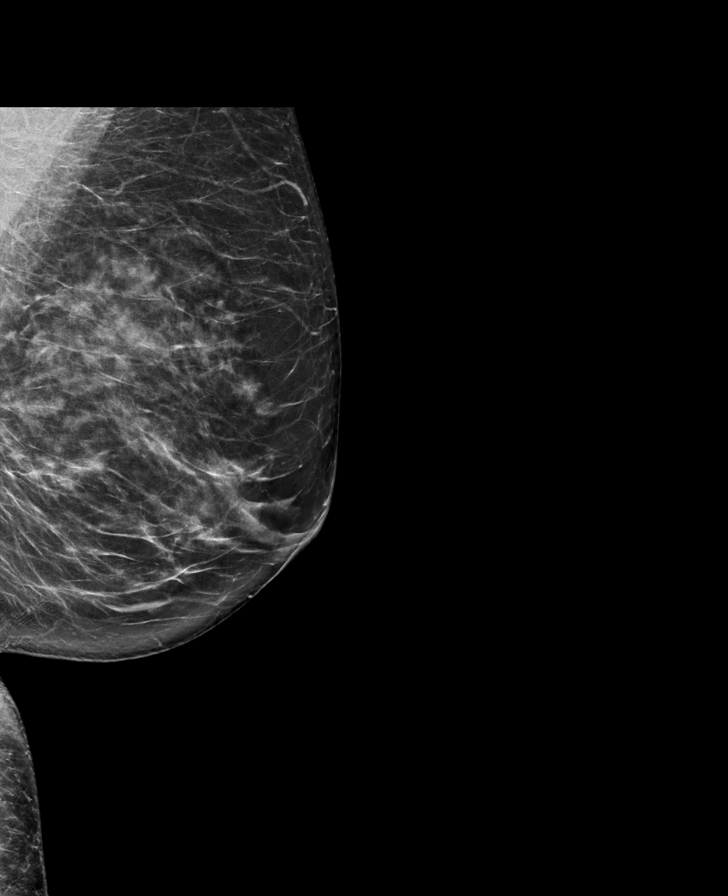

[L CC synth-2D]
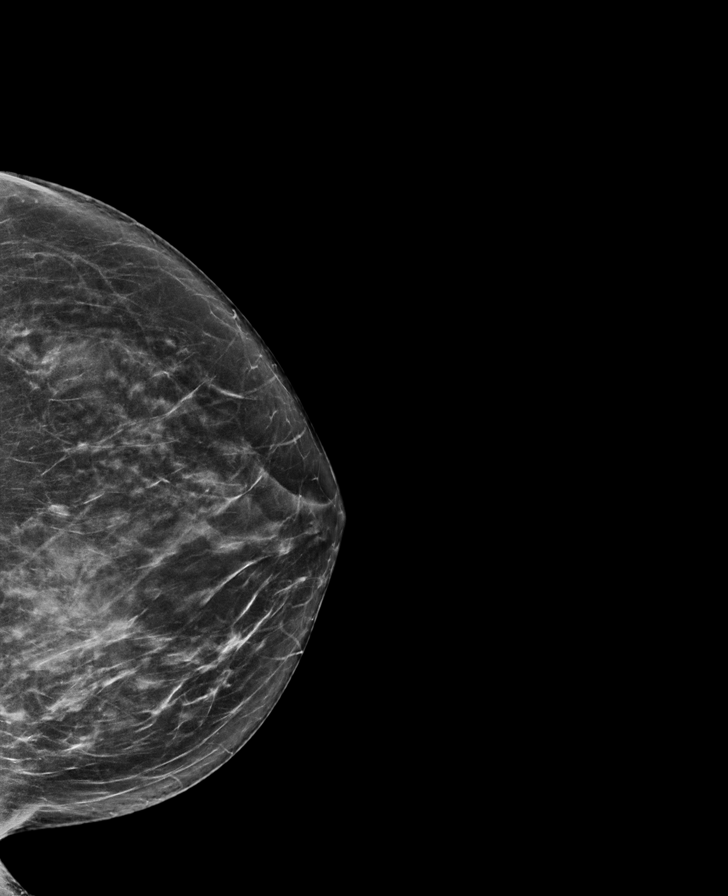

[R CC tomo · tomo slice 35/69.0]
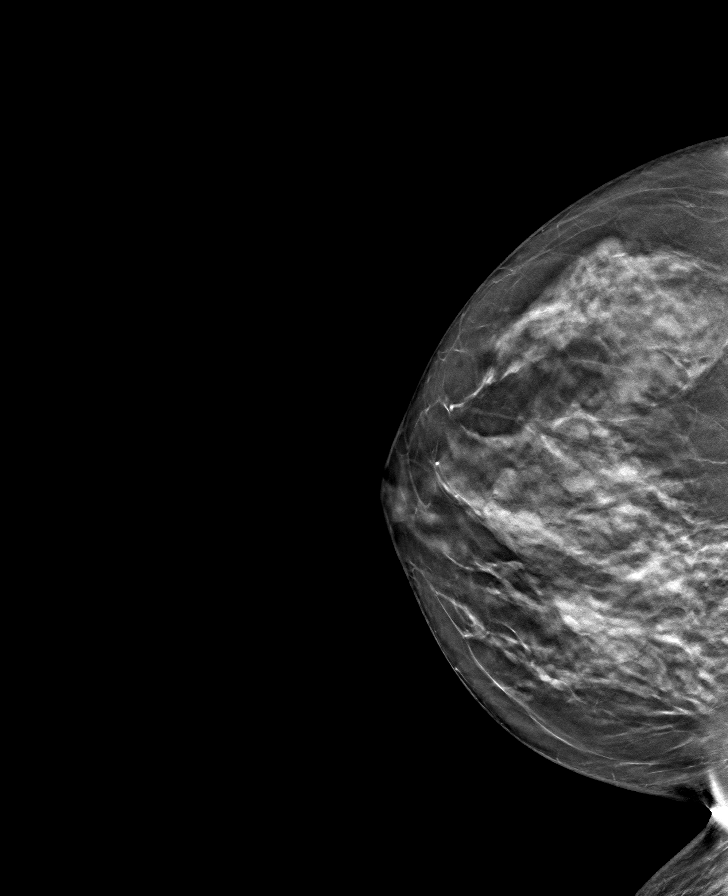

[L MLO tomo · tomo slice 37/74.0]
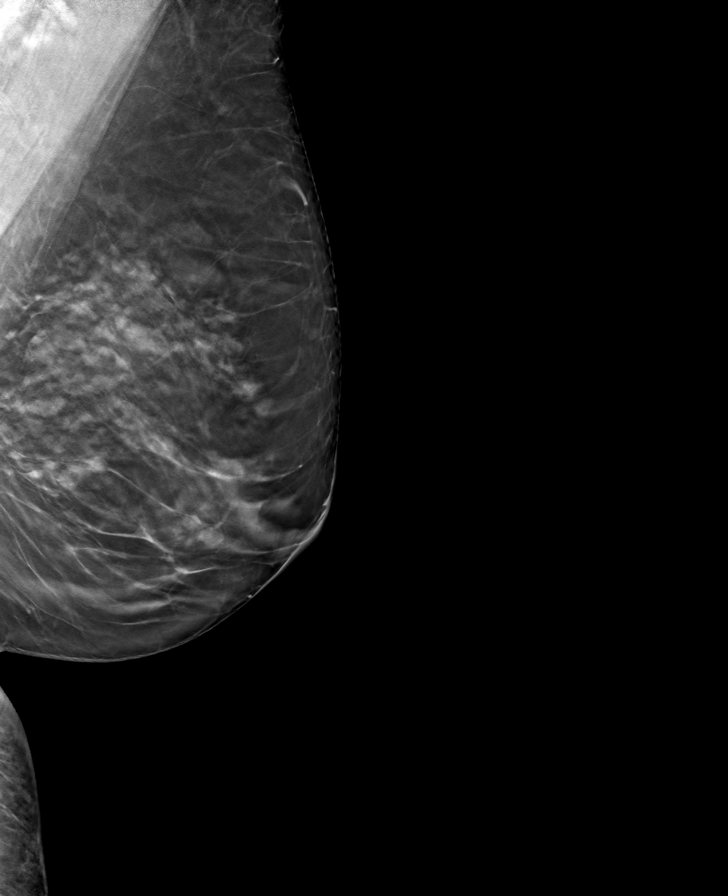

[L CC tomo · tomo slice 35/68.0]
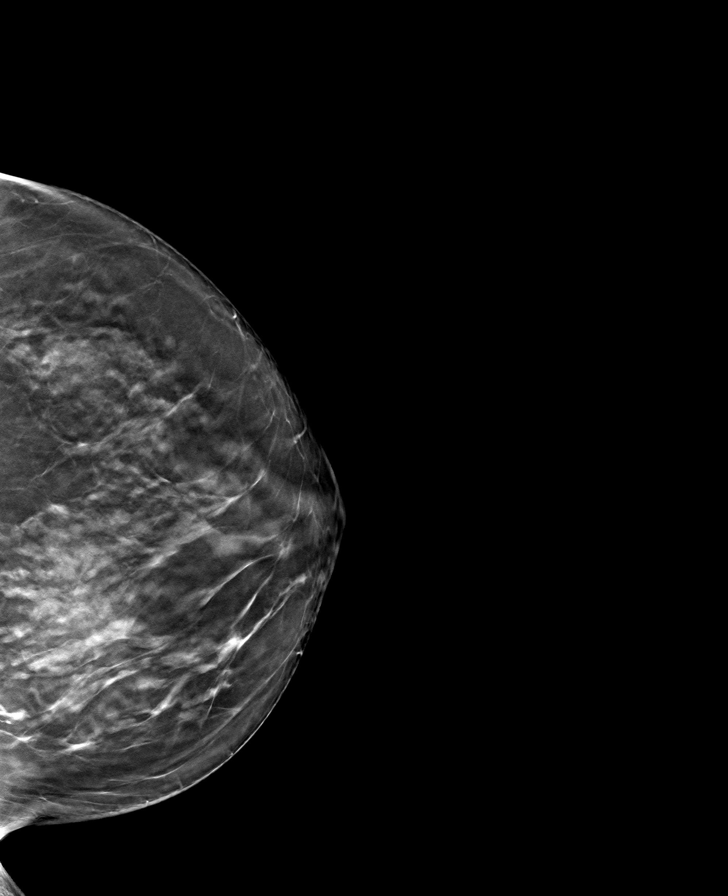

[R MLO tomo · tomo slice 37/72.0]
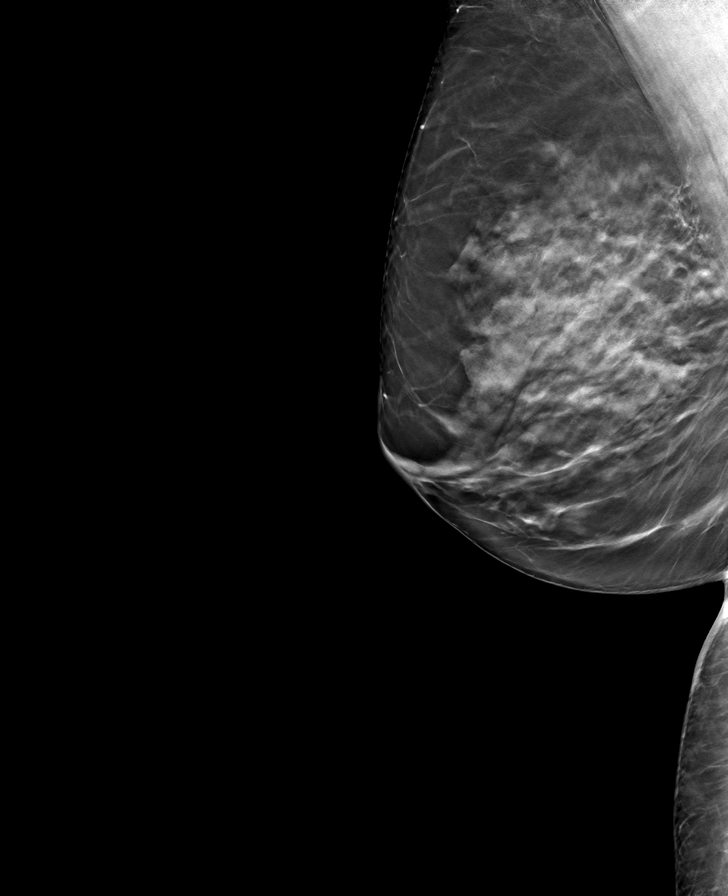

[8 of 24 positions shown; findings below may reference images not displayed]

ACR Breast Density Category c: The breast tissue is heterogeneously
dense, which may obscure small masses.
FINDINGS: There are no findings suspicious for malignancy.
IMPRESSION: No mammographic evidence of malignancy. A result letter of this
screening mammogram will be mailed directly to the patient.

RECOMMENDATION:
Screening mammogram in one year. (Code:Q3-W-BC3)

BI-RADS CATEGORY  1: Negative.

## 2022-04-26 DIAGNOSIS — L501 Idiopathic urticaria: Secondary | ICD-10-CM | POA: Diagnosis not present

## 2022-07-02 DIAGNOSIS — L501 Idiopathic urticaria: Secondary | ICD-10-CM | POA: Diagnosis not present

## 2022-08-23 ENCOUNTER — Other Ambulatory Visit: Payer: Self-pay | Admitting: Internal Medicine

## 2022-08-23 DIAGNOSIS — Z1231 Encounter for screening mammogram for malignant neoplasm of breast: Secondary | ICD-10-CM

## 2022-08-29 DIAGNOSIS — G479 Sleep disorder, unspecified: Secondary | ICD-10-CM | POA: Diagnosis not present

## 2022-08-29 DIAGNOSIS — E669 Obesity, unspecified: Secondary | ICD-10-CM | POA: Diagnosis not present

## 2022-08-29 DIAGNOSIS — Z87898 Personal history of other specified conditions: Secondary | ICD-10-CM | POA: Diagnosis not present

## 2022-08-29 DIAGNOSIS — E559 Vitamin D deficiency, unspecified: Secondary | ICD-10-CM | POA: Diagnosis not present

## 2022-08-29 DIAGNOSIS — Z23 Encounter for immunization: Secondary | ICD-10-CM | POA: Diagnosis not present

## 2022-08-29 DIAGNOSIS — J4540 Moderate persistent asthma, uncomplicated: Secondary | ICD-10-CM | POA: Diagnosis not present

## 2022-08-29 DIAGNOSIS — G2581 Restless legs syndrome: Secondary | ICD-10-CM | POA: Diagnosis not present

## 2022-09-04 DIAGNOSIS — L501 Idiopathic urticaria: Secondary | ICD-10-CM | POA: Diagnosis not present

## 2022-09-18 DIAGNOSIS — I10 Essential (primary) hypertension: Secondary | ICD-10-CM | POA: Diagnosis not present

## 2022-09-18 DIAGNOSIS — F5104 Psychophysiologic insomnia: Secondary | ICD-10-CM | POA: Diagnosis not present

## 2022-09-18 DIAGNOSIS — G4719 Other hypersomnia: Secondary | ICD-10-CM | POA: Diagnosis not present

## 2022-09-18 DIAGNOSIS — E669 Obesity, unspecified: Secondary | ICD-10-CM | POA: Diagnosis not present

## 2022-09-19 DIAGNOSIS — N951 Menopausal and female climacteric states: Secondary | ICD-10-CM | POA: Diagnosis not present

## 2022-09-19 DIAGNOSIS — E785 Hyperlipidemia, unspecified: Secondary | ICD-10-CM | POA: Diagnosis not present

## 2022-09-19 DIAGNOSIS — R030 Elevated blood-pressure reading, without diagnosis of hypertension: Secondary | ICD-10-CM | POA: Diagnosis not present

## 2022-10-03 DIAGNOSIS — N951 Menopausal and female climacteric states: Secondary | ICD-10-CM | POA: Diagnosis not present

## 2022-10-03 DIAGNOSIS — R030 Elevated blood-pressure reading, without diagnosis of hypertension: Secondary | ICD-10-CM | POA: Diagnosis not present

## 2022-10-04 DIAGNOSIS — N951 Menopausal and female climacteric states: Secondary | ICD-10-CM | POA: Diagnosis not present

## 2022-10-16 DIAGNOSIS — L501 Idiopathic urticaria: Secondary | ICD-10-CM | POA: Diagnosis not present

## 2022-11-05 ENCOUNTER — Ambulatory Visit
Admission: RE | Admit: 2022-11-05 | Discharge: 2022-11-05 | Disposition: A | Discharge status: Home / Self Care | Payer: Medicare PPO | Source: Ambulatory Visit | Attending: Internal Medicine | Admitting: Internal Medicine

## 2022-11-05 DIAGNOSIS — Z1231 Encounter for screening mammogram for malignant neoplasm of breast: Secondary | ICD-10-CM

## 2022-11-27 DIAGNOSIS — L501 Idiopathic urticaria: Secondary | ICD-10-CM | POA: Diagnosis not present

## 2023-01-10 DIAGNOSIS — J029 Acute pharyngitis, unspecified: Secondary | ICD-10-CM | POA: Diagnosis not present

## 2023-01-10 DIAGNOSIS — Z03818 Encounter for observation for suspected exposure to other biological agents ruled out: Secondary | ICD-10-CM | POA: Diagnosis not present

## 2023-01-10 DIAGNOSIS — R051 Acute cough: Secondary | ICD-10-CM | POA: Diagnosis not present

## 2023-01-10 DIAGNOSIS — R0981 Nasal congestion: Secondary | ICD-10-CM | POA: Diagnosis not present

## 2023-01-14 DIAGNOSIS — L501 Idiopathic urticaria: Secondary | ICD-10-CM | POA: Diagnosis not present

## 2023-02-13 DIAGNOSIS — R051 Acute cough: Secondary | ICD-10-CM | POA: Diagnosis not present

## 2023-02-13 DIAGNOSIS — R0981 Nasal congestion: Secondary | ICD-10-CM | POA: Diagnosis not present

## 2023-02-13 DIAGNOSIS — J069 Acute upper respiratory infection, unspecified: Secondary | ICD-10-CM | POA: Diagnosis not present

## 2023-02-13 DIAGNOSIS — R52 Pain, unspecified: Secondary | ICD-10-CM | POA: Diagnosis not present

## 2023-02-13 DIAGNOSIS — Z03818 Encounter for observation for suspected exposure to other biological agents ruled out: Secondary | ICD-10-CM | POA: Diagnosis not present

## 2023-02-21 DIAGNOSIS — L501 Idiopathic urticaria: Secondary | ICD-10-CM | POA: Diagnosis not present

## 2023-03-06 DIAGNOSIS — H9193 Unspecified hearing loss, bilateral: Secondary | ICD-10-CM | POA: Diagnosis not present

## 2023-03-06 DIAGNOSIS — Z1331 Encounter for screening for depression: Secondary | ICD-10-CM | POA: Diagnosis not present

## 2023-03-06 DIAGNOSIS — Z136 Encounter for screening for cardiovascular disorders: Secondary | ICD-10-CM | POA: Diagnosis not present

## 2023-03-06 DIAGNOSIS — Z0000 Encounter for general adult medical examination without abnormal findings: Secondary | ICD-10-CM | POA: Diagnosis not present

## 2023-03-06 DIAGNOSIS — G479 Sleep disorder, unspecified: Secondary | ICD-10-CM | POA: Diagnosis not present

## 2023-03-06 DIAGNOSIS — J4520 Mild intermittent asthma, uncomplicated: Secondary | ICD-10-CM | POA: Diagnosis not present

## 2023-03-06 DIAGNOSIS — N951 Menopausal and female climacteric states: Secondary | ICD-10-CM | POA: Diagnosis not present

## 2023-03-27 DIAGNOSIS — H903 Sensorineural hearing loss, bilateral: Secondary | ICD-10-CM | POA: Diagnosis not present

## 2023-04-02 DIAGNOSIS — L509 Urticaria, unspecified: Secondary | ICD-10-CM | POA: Diagnosis not present

## 2023-04-12 DIAGNOSIS — H905 Unspecified sensorineural hearing loss: Secondary | ICD-10-CM | POA: Diagnosis not present

## 2023-04-12 DIAGNOSIS — M21619 Bunion of unspecified foot: Secondary | ICD-10-CM | POA: Diagnosis not present

## 2023-04-12 DIAGNOSIS — G479 Sleep disorder, unspecified: Secondary | ICD-10-CM | POA: Diagnosis not present

## 2023-04-24 ENCOUNTER — Ambulatory Visit: Payer: Medicare PPO | Admitting: Podiatry

## 2023-04-24 ENCOUNTER — Telehealth: Payer: Self-pay | Admitting: Podiatry

## 2023-04-24 ENCOUNTER — Ambulatory Visit (INDEPENDENT_AMBULATORY_CARE_PROVIDER_SITE_OTHER): Payer: Medicare PPO

## 2023-04-24 ENCOUNTER — Encounter: Payer: Self-pay | Admitting: Podiatry

## 2023-04-24 DIAGNOSIS — M21619 Bunion of unspecified foot: Secondary | ICD-10-CM

## 2023-04-24 DIAGNOSIS — M79671 Pain in right foot: Secondary | ICD-10-CM

## 2023-04-24 DIAGNOSIS — M79672 Pain in left foot: Secondary | ICD-10-CM

## 2023-04-24 NOTE — Telephone Encounter (Signed)
Left voicemail for patient to let her know that I was scheduling her to see Dr. Ralene Cork on Monday, 05/06/2023 at 11:30 am as Dr. Dionne Bucy next available would not be until after July 4th. Asked pt to call us back to confirm if this works for her.

## 2023-04-25 ENCOUNTER — Other Ambulatory Visit: Payer: Self-pay | Admitting: Podiatry

## 2023-04-25 DIAGNOSIS — M21619 Bunion of unspecified foot: Secondary | ICD-10-CM

## 2023-04-25 DIAGNOSIS — M79671 Pain in right foot: Secondary | ICD-10-CM

## 2023-04-25 NOTE — Progress Notes (Signed)
Subjective:   Patient ID: Kayla Henry, female   DOB: 67 y.o.   MRN: [id number]   HPI Patient presents with severe bilateral bunion deformity left over right that is been present for years and it is making it difficult for her to wear shoe gear comfortably.  Patient states she has tried wider shoes she has tried other modalities without relief with family history of this problem present.  Patient does not smoke likes to be active   Review of Systems  All other systems reviewed and are negative.       Objective:  Physical Exam Vitals and nursing note reviewed.  Constitutional:      Appearance: She is well-developed.  Pulmonary:     Effort: Pulmonary effort is normal.  Musculoskeletal:        General: Normal range of motion.  Skin:    General: Skin is warm.  Neurological:     Mental Status: She is alert.     Neurovascular status intact muscle strength adequate range of motion within normal limits.  Patient has severe bunion deformity left over right with prominence of the structure and deviation of the big toe.  Patient does not have any issue with range of motion and there is no crepitus of the joint associated with this and has good digital perfusion well-oriented x 3     Assessment:  Severe structural bunion deformity left over right with splaying of the foot deviation and pain     Plan:  H&P reviewed condition.  I have recommended that this will require especially for the left Lapidus with the most likely for Lapidus right depending on how the left turns out.  I educated her on deformity explained procedure risk and I had Dr. Ralene Cork take a look at her and I am referring this patient to her for the Lapidus procedure.  Patient was given all instructions and will have this performed sometime in July  X-rays indicate that there is significant elevation of the intermetatarsal angle of 18 to 20 degrees left over right with instability noted

## 2023-05-06 ENCOUNTER — Encounter: Payer: Self-pay | Admitting: Podiatry

## 2023-05-06 ENCOUNTER — Ambulatory Visit: Payer: Medicare PPO | Admitting: Podiatry

## 2023-05-06 DIAGNOSIS — M2012 Hallux valgus (acquired), left foot: Secondary | ICD-10-CM

## 2023-05-06 DIAGNOSIS — G4700 Insomnia, unspecified: Secondary | ICD-10-CM | POA: Insufficient documentation

## 2023-05-06 DIAGNOSIS — M21612 Bunion of left foot: Secondary | ICD-10-CM | POA: Diagnosis not present

## 2023-05-06 DIAGNOSIS — J45909 Unspecified asthma, uncomplicated: Secondary | ICD-10-CM | POA: Insufficient documentation

## 2023-05-06 NOTE — Progress Notes (Signed)
Chief Complaint  Patient presents with   Bunions     Left foot surgery consult    HPI: 67 y.o. female presenting today for bunion evaluation.  She was originally scheduled to see Dr. Ralene Cork today.  However the doctor had to cancel her appointments unexpectedly today and patient was seen by me.  She was referred to Dr. Ralene Cork by Dr. Charlsie Merles to have a Lapidus bunionectomy performed.  She has had the bunion for years.  She does note pain in the big toe joint on the left as well as the first metatarsal-medial cuneiform joint.  Denies trauma.  She also has a bunion on the right foot but will have this performed at a later date.  She presents to discuss surgical procedure and planning as well as postop risks and complications today.  Past Medical History:  Diagnosis Date   Allergy    Asthma    Chronic sinusitis    Diverticulosis    RLS (restless legs syndrome)     Past Surgical History:  Procedure Laterality Date   COLONOSCOPY     Dr. Elnoria Howard   NASAL POLYP SURGERY      Allergies  Allergen Reactions   Cephalosporins     hives   Sulfa Antibiotics Hives    Review of Systems  Musculoskeletal:  Positive for joint pain.      PHYSICAL EXAM:  General: The patient is alert and oriented x3 in no acute distress.  Dermatology: Skin is warm, dry and supple bilateral lower extremities. Interspaces are clear of maceration and debris.  No rashes noted.   Vascular: Palpable pedal pulses bilaterally. Capillary refill within normal limits.  No appreciable edema.  No erythema or calor.  Neurological: Light touch sensation grossly intact bilateral feet.   Musculoskeletal Exam:  There is a bony medial prominence on the dorsomedial aspect of the 1st metatarsal head of the left foot.  There is pain on palpation of the "bump" in this area.  Lateral deviation of the hallux at the MPJ level.  1st MPJ ROM is decreased.  No pain with range of motion of the first metatarsophalangeal joint.  No  crepitus.  Hallux is tracking, not trackbound.  It is reducible.  There is a mild palpable dorsal prominence at the first metatarsal-medial cuneiform joint.  There is pain on palpation of this joint.  RADIOGRAPHIC EXAM (left foot 2 weightbearing views, obtained on 04/24/2023):  Increased first intermetatarsal angle greater than 16 degrees degrees.  Increased hallux abductus angle.  Enlargement of bone at dorsomedial 1st metatarsal head.  Uneven joint space narrowing at the first metatarsophalangeal joint.  Tibial sesamoid position is 6.  No first ray elevatus noted on lateral view.  ASSESSMENT/PLAN OF CARE: 1. Hallux valgus with bunions of left foot    Discussed patient's condition and possible etiologies today.    With the patient's x-rays pulled up in the room, discussed the surgery in detail that Dr. Juanetta Gosling will be performing.  We discussed a Lapidus bunionectomy with possible Akin osteotomy of the left foot.  Discussed screw and plate and possibly stable fixation as preferred by Dr. Ralene Cork.  Discussed risks involved with having surgery which also include infection, nonunion, delayed healing, reaction to medication.  Benefits risks and possible postoperative complications were discussed in detail with the patient today.  All questions were answered today.  Also discussed possible sequela of the patient does not proceed with any surgical intervention which would include worsening of the  bunion deformity and worsening of pain.  Discussed timing of surgery and she states that she is ready to have surgery at any time.  Her left foot is worse than the right so she would like to proceed with the left foot at this time.  We discussed the use of a knee scooter postoperatively as she will most likely be nonweightbearing for the first week or 2 after the procedure and then Dr. Ralene Cork will let her know any changes in weightbearing allowances at her postop visits.  Confirmed with patient that she is a  non-smoker.  Informed patient that this will be an outpatient surgical procedure which may take a total of 4 4-1/2 hours of time.  This was usually performed under intravenous sedation with local anesthesia to the foot.  Discussed need to be n.p.o. after midnight the night before the surgery.  She was given a surgery packet/handbook today.  The consent form was reviewed and signed by patient today.  Informed patient that we will be checking for any prior authorizations that might be needed for this procedure.   Return for Burnett Harry will reach out to schedule surgery and p/o appts.    Clerance Lav, DPM, FACFAS [facility]     [address] [place], [place] [address]                Office [phone]  Fax [phone]

## 2023-05-08 DIAGNOSIS — L209 Atopic dermatitis, unspecified: Secondary | ICD-10-CM | POA: Diagnosis not present

## 2023-05-13 DIAGNOSIS — L501 Idiopathic urticaria: Secondary | ICD-10-CM | POA: Diagnosis not present

## 2023-05-21 ENCOUNTER — Other Ambulatory Visit: Payer: Self-pay | Admitting: Podiatry

## 2023-05-21 DIAGNOSIS — G8918 Other acute postprocedural pain: Secondary | ICD-10-CM | POA: Diagnosis not present

## 2023-05-21 DIAGNOSIS — M2012 Hallux valgus (acquired), left foot: Secondary | ICD-10-CM | POA: Diagnosis not present

## 2023-05-21 DIAGNOSIS — M21612 Bunion of left foot: Secondary | ICD-10-CM | POA: Diagnosis not present

## 2023-05-21 MED ORDER — CLINDAMYCIN HCL 300 MG PO CAPS: 300.0000 mg | ORAL_CAPSULE | Freq: Three times a day (TID) | ORAL | 0 refills | Status: AC

## 2023-05-21 MED ORDER — ONDANSETRON HCL 4 MG PO TABS: 4.0000 mg | ORAL_TABLET | Freq: Three times a day (TID) | ORAL | 0 refills | Status: AC | PRN

## 2023-05-21 MED ORDER — ASPIRIN 81 MG PO TBEC: 81.0000 mg | DELAYED_RELEASE_TABLET | Freq: Two times a day (BID) | ORAL | 1 refills | Status: AC

## 2023-05-21 MED ORDER — OXYCODONE-ACETAMINOPHEN 5-325 MG PO TABS: 1.0000 | ORAL_TABLET | ORAL | 0 refills | Status: AC | PRN

## 2023-05-28 ENCOUNTER — Ambulatory Visit (INDEPENDENT_AMBULATORY_CARE_PROVIDER_SITE_OTHER): Payer: Medicare PPO | Admitting: Podiatry

## 2023-05-28 ENCOUNTER — Encounter: Payer: Self-pay | Admitting: Podiatry

## 2023-05-28 ENCOUNTER — Ambulatory Visit: Payer: Medicare PPO

## 2023-05-28 DIAGNOSIS — Z9889 Other specified postprocedural states: Secondary | ICD-10-CM

## 2023-05-28 NOTE — Progress Notes (Signed)
Subjective:  Patient ID: Kayla Henry, female    DOB: July 13, 1956,  MRN: [id number]  Chief Complaint  Patient presents with   Routine Post Op    POV # 1 DOS - 05/21/23 --- BUNIONECTOMY WITH OSTEOTOMY/ FUSION AT LEFT 1ST METATARSAL- MEDICAL CONEIFORM JOINT, POSSIBLE WEDGE OSTEOTOMY OF LEFT 1ST PROXIMAL PHALANX INTERNAL FIXATION TO BE USED  Patient states she has been in pain , stop using the pain medication states she does not like the way it makes her feel    DOS: 05/21/23  Procedure: Left lapidus bunionectomy   67 y.o. female returns for POV#1. Doing well and managing ok. She does not like taking the pain medication so has been dealing with the pain.   Review of Systems: Negative except as noted in the HPI. Denies N/V/F/Ch.  Past Medical History:  Diagnosis Date   Allergy    Asthma    Chronic sinusitis    Diverticulosis    RLS (restless legs syndrome)     Current Outpatient Medications:    amoxicillin-clavulanate (AUGMENTIN) 875-125 MG tablet, Take 1 tablet by mouth 2 (two) times daily., Disp: 20 tablet, Rfl: 0   aspirin EC 81 MG tablet, Take 1 tablet (81 mg total) by mouth in the morning and at bedtime. Swallow whole., Disp: 90 tablet, Rfl: 1   budesonide-formoterol (SYMBICORT) 160-4.5 MCG/ACT inhaler, Inhale 2 puffs into the lungs 2 (two) times daily., Disp: , Rfl:    fluticasone (FLONASE) 50 MCG/ACT nasal spray, Place 1 spray into both nostrils daily., Disp: , Rfl:    fluticasone (FLONASE) 50 MCG/ACT nasal spray, USE TWO SPRAY(S) IN EACH NOSTRIL EVERY DAY, Disp: 48 g, Rfl: 3   meloxicam (MOBIC) 15 MG tablet, Take 15 mg by mouth daily., Disp: , Rfl:    montelukast (SINGULAIR) 10 MG tablet, Place 1 tablet (10 mg total) into feeding tube at bedtime., Disp: , Rfl:    montelukast (SINGULAIR) 10 MG tablet, TAKE ONE TABLET BY MOUTH AT BEDTIME, Disp: 30 tablet, Rfl: 0   ondansetron (ZOFRAN) 4 MG tablet, Take 1 tablet (4 mg total) by mouth every 8 (eight) hours as needed for nausea or  vomiting., Disp: 20 tablet, Rfl: 0   oxyCODONE-acetaminophen (PERCOCET/ROXICET) 5-325 MG tablet, Take 1 tablet by mouth every 4 (four) hours as needed for up to 7 days for severe pain., Disp: 42 tablet, Rfl: 0   rOPINIRole (REQUIP) 1 MG tablet, TAKE ONE TABLET BY MOUTH THREE TIMES DAILY, Disp: 30 tablet, Rfl: 5   SYMBICORT 160-4.5 MCG/ACT inhaler, INHALE TWO PUFFS INTO THE LUNGS TWICE DAILY., Disp: 1 Inhaler, Rfl: 11  Social History   Tobacco Use  Smoking Status Never  Smokeless Tobacco Never    Allergies  Allergen Reactions   Cephalosporins     hives   Sulfa Antibiotics Hives   Objective:  There were no vitals filed for this visit. There is no height or weight on file to calculate BMI. Constitutional Well developed. Well nourished.  Vascular Foot warm and well perfused. Capillary refill normal to all digits.   Neurologic Normal speech. Oriented to person, place, and time. Epicritic sensation to light touch grossly present bilaterally.  Dermatologic Skin healing well without signs of infection. Skin edges well coapted without signs of infection.  Orthopedic: Tenderness to palpation noted about the surgical site.   Radiographs: Hardware intact and improved alignment of toe.   Assessment:   1. Status post surgery    Plan:  Patient was evaluated and treated and all questions  answered.  S/p foot surgery left -Progressing as expected post-operatively. -WB Status: NWB in CAM boot -Sutures: intact. -Medications: n/a -Foot redressed. Return in 2 weeks for next post-op check   Return in about 2 weeks (around 06/11/2023) for post op.

## 2023-06-10 ENCOUNTER — Encounter: Payer: Self-pay | Admitting: Podiatry

## 2023-06-10 ENCOUNTER — Ambulatory Visit (INDEPENDENT_AMBULATORY_CARE_PROVIDER_SITE_OTHER): Payer: Medicare PPO | Admitting: Podiatry

## 2023-06-10 DIAGNOSIS — Z9889 Other specified postprocedural states: Secondary | ICD-10-CM

## 2023-06-10 NOTE — Progress Notes (Signed)
  Subjective:  Patient ID: Kayla Henry, female    DOB: Jun 20, 1956,  MRN: [id number]  No chief complaint on file.   DOS: 05/21/23  Procedure: Left lapidus bunionectomy   67 y.o. female returns for POV#2. Doing well and managing ok. Has continued to stay off foot.   Review of Systems: Negative except as noted in the HPI. Denies N/V/F/Ch.  Past Medical History:  Diagnosis Date   Allergy    Asthma    Chronic sinusitis    Diverticulosis    RLS (restless legs syndrome)     Current Outpatient Medications:    amoxicillin-clavulanate (AUGMENTIN) 875-125 MG tablet, Take 1 tablet by mouth 2 (two) times daily., Disp: 20 tablet, Rfl: 0   aspirin EC 81 MG tablet, Take 1 tablet (81 mg total) by mouth in the morning and at bedtime. Swallow whole., Disp: 90 tablet, Rfl: 1   budesonide-formoterol (SYMBICORT) 160-4.5 MCG/ACT inhaler, Inhale 2 puffs into the lungs 2 (two) times daily., Disp: , Rfl:    fluticasone (FLONASE) 50 MCG/ACT nasal spray, Place 1 spray into both nostrils daily., Disp: , Rfl:    fluticasone (FLONASE) 50 MCG/ACT nasal spray, USE TWO SPRAY(S) IN EACH NOSTRIL EVERY DAY, Disp: 48 g, Rfl: 3   meloxicam (MOBIC) 15 MG tablet, Take 15 mg by mouth daily., Disp: , Rfl:    montelukast (SINGULAIR) 10 MG tablet, Place 1 tablet (10 mg total) into feeding tube at bedtime., Disp: , Rfl:    montelukast (SINGULAIR) 10 MG tablet, TAKE ONE TABLET BY MOUTH AT BEDTIME, Disp: 30 tablet, Rfl: 0   ondansetron (ZOFRAN) 4 MG tablet, Take 1 tablet (4 mg total) by mouth every 8 (eight) hours as needed for nausea or vomiting., Disp: 20 tablet, Rfl: 0   rOPINIRole (REQUIP) 1 MG tablet, TAKE ONE TABLET BY MOUTH THREE TIMES DAILY, Disp: 30 tablet, Rfl: 5   SYMBICORT 160-4.5 MCG/ACT inhaler, INHALE TWO PUFFS INTO THE LUNGS TWICE DAILY., Disp: 1 Inhaler, Rfl: 11  Social History   Tobacco Use  Smoking Status Never  Smokeless Tobacco Never    Allergies  Allergen Reactions   Cephalosporins     hives    Sulfa Antibiotics Hives   Objective:  There were no vitals filed for this visit. There is no height or weight on file to calculate BMI. Constitutional Well developed. Well nourished.  Vascular Foot warm and well perfused. Capillary refill normal to all digits.   Neurologic Normal speech. Oriented to person, place, and time. Epicritic sensation to light touch grossly present bilaterally.  Dermatologic Skin healing well without signs of infection. Skin edges well coapted without signs of infection.  Orthopedic: Tenderness to palpation noted about the surgical site.   Radiographs: Hardware intact and improved alignment of toe.   Assessment:   1. Status post surgery     Plan:  Patient was evaluated and treated and all questions answered.  S/p foot surgery left -Progressing as expected post-operatively. -WB Status: NWB in CAM boot for one more week then may begin WBAT in the CAM boot.  -Sutures: intact, removed non absorbable sutures.  -Medications: n/a -Foot redressed. May begin showering tomorrow. Advised to wait one more week before soaking foot.  Return in 3 weeks for recheck.   No follow-ups on file.

## 2023-06-25 DIAGNOSIS — L509 Urticaria, unspecified: Secondary | ICD-10-CM | POA: Diagnosis not present

## 2023-06-25 DIAGNOSIS — L501 Idiopathic urticaria: Secondary | ICD-10-CM | POA: Diagnosis not present

## 2023-07-02 ENCOUNTER — Encounter: Payer: Self-pay | Admitting: Podiatry

## 2023-07-02 ENCOUNTER — Ambulatory Visit (INDEPENDENT_AMBULATORY_CARE_PROVIDER_SITE_OTHER): Payer: Medicare PPO

## 2023-07-02 ENCOUNTER — Ambulatory Visit (INDEPENDENT_AMBULATORY_CARE_PROVIDER_SITE_OTHER): Payer: Medicare PPO | Admitting: Podiatry

## 2023-07-02 DIAGNOSIS — Z9889 Other specified postprocedural states: Secondary | ICD-10-CM | POA: Diagnosis not present

## 2023-07-02 NOTE — Progress Notes (Signed)
  Subjective:  Patient ID: Kayla Henry, female    DOB: 20-Dec-1955,  MRN: [id number]  No chief complaint on file.   DOS: 05/21/23  Procedure: Left lapidus bunionectomy   67 y.o. female returns for POV#3. Doing well and managing ok. Has had some itching.   Review of Systems: Negative except as noted in the HPI. Denies N/V/F/Ch.  Past Medical History:  Diagnosis Date   Allergy    Asthma    Chronic sinusitis    Diverticulosis    RLS (restless legs syndrome)     Current Outpatient Medications:    amoxicillin-clavulanate (AUGMENTIN) 875-125 MG tablet, Take 1 tablet by mouth 2 (two) times daily., Disp: 20 tablet, Rfl: 0   aspirin EC 81 MG tablet, Take 1 tablet (81 mg total) by mouth in the morning and at bedtime. Swallow whole., Disp: 90 tablet, Rfl: 1   budesonide-formoterol (SYMBICORT) 160-4.5 MCG/ACT inhaler, Inhale 2 puffs into the lungs 2 (two) times daily., Disp: , Rfl:    fluticasone (FLONASE) 50 MCG/ACT nasal spray, Place 1 spray into both nostrils daily., Disp: , Rfl:    fluticasone (FLONASE) 50 MCG/ACT nasal spray, USE TWO SPRAY(S) IN EACH NOSTRIL EVERY DAY, Disp: 48 g, Rfl: 3   meloxicam (MOBIC) 15 MG tablet, Take 15 mg by mouth daily., Disp: , Rfl:    montelukast (SINGULAIR) 10 MG tablet, Place 1 tablet (10 mg total) into feeding tube at bedtime., Disp: , Rfl:    montelukast (SINGULAIR) 10 MG tablet, TAKE ONE TABLET BY MOUTH AT BEDTIME, Disp: 30 tablet, Rfl: 0   ondansetron (ZOFRAN) 4 MG tablet, Take 1 tablet (4 mg total) by mouth every 8 (eight) hours as needed for nausea or vomiting., Disp: 20 tablet, Rfl: 0   rOPINIRole (REQUIP) 1 MG tablet, TAKE ONE TABLET BY MOUTH THREE TIMES DAILY, Disp: 30 tablet, Rfl: 5   SYMBICORT 160-4.5 MCG/ACT inhaler, INHALE TWO PUFFS INTO THE LUNGS TWICE DAILY., Disp: 1 Inhaler, Rfl: 11  Social History   Tobacco Use  Smoking Status Never  Smokeless Tobacco Never    Allergies  Allergen Reactions   Cephalosporins     hives   Sulfa  Antibiotics Hives   Objective:  There were no vitals filed for this visit. There is no height or weight on file to calculate BMI. Constitutional Well developed. Well nourished.  Vascular Foot warm and well perfused. Capillary refill normal to all digits.   Neurologic Normal speech. Oriented to person, place, and time. Epicritic sensation to light touch grossly present bilaterally.  Dermatologic Skin healing well without signs of infection. Skin edges well coapted without signs of infection.  Orthopedic: Tenderness to palpation noted about the surgical site.   Radiographs: Hardware intact and improved alignment of toe.   Assessment:   1. Status post surgery     Plan:  Patient was evaluated and treated and all questions answered.  S/p foot surgery left -Progressing as expected post-operatively. -WB Status: May begin transition from CAM boot to regular shoes.  -Medications: n/a Return in 4 weeks for follow-up to discuss the other foot procedure.   No follow-ups on file.

## 2023-07-15 DIAGNOSIS — Z8709 Personal history of other diseases of the respiratory system: Secondary | ICD-10-CM | POA: Diagnosis not present

## 2023-07-15 DIAGNOSIS — Z03818 Encounter for observation for suspected exposure to other biological agents ruled out: Secondary | ICD-10-CM | POA: Diagnosis not present

## 2023-07-15 DIAGNOSIS — R058 Other specified cough: Secondary | ICD-10-CM | POA: Diagnosis not present

## 2023-07-15 DIAGNOSIS — J209 Acute bronchitis, unspecified: Secondary | ICD-10-CM | POA: Diagnosis not present

## 2023-07-30 ENCOUNTER — Ambulatory Visit: Payer: Medicare PPO | Admitting: Podiatry

## 2023-07-30 ENCOUNTER — Encounter: Payer: Self-pay | Admitting: Podiatry

## 2023-07-30 ENCOUNTER — Ambulatory Visit (INDEPENDENT_AMBULATORY_CARE_PROVIDER_SITE_OTHER): Payer: Medicare PPO

## 2023-07-30 DIAGNOSIS — M2012 Hallux valgus (acquired), left foot: Secondary | ICD-10-CM

## 2023-07-30 DIAGNOSIS — M2011 Hallux valgus (acquired), right foot: Secondary | ICD-10-CM

## 2023-07-30 DIAGNOSIS — Z9889 Other specified postprocedural states: Secondary | ICD-10-CM

## 2023-07-30 NOTE — Progress Notes (Signed)
  Subjective:  Patient ID: Kayla Henry, female    DOB: December 28, 1955,  MRN: [id number]  Chief Complaint  Patient presents with   Routine Post Op     Pt returns for POV#4 she stated she is doing well and getting better.    DOS: 05/21/23  Procedure: Left lapidus bunionectomy   67 y.o. female returns for POV#4. Doing well and managing well.   Review of Systems: Negative except as noted in the HPI. Denies N/V/F/Ch.  Past Medical History:  Diagnosis Date   Allergy    Asthma    Chronic sinusitis    Diverticulosis    RLS (restless legs syndrome)     Current Outpatient Medications:    amoxicillin-clavulanate (AUGMENTIN) 875-125 MG tablet, Take 1 tablet by mouth 2 (two) times daily., Disp: 20 tablet, Rfl: 0   aspirin EC 81 MG tablet, Take 1 tablet (81 mg total) by mouth in the morning and at bedtime. Swallow whole., Disp: 90 tablet, Rfl: 1   budesonide-formoterol (SYMBICORT) 160-4.5 MCG/ACT inhaler, Inhale 2 puffs into the lungs 2 (two) times daily., Disp: , Rfl:    fluticasone (FLONASE) 50 MCG/ACT nasal spray, Place 1 spray into both nostrils daily., Disp: , Rfl:    fluticasone (FLONASE) 50 MCG/ACT nasal spray, USE TWO SPRAY(S) IN EACH NOSTRIL EVERY DAY, Disp: 48 g, Rfl: 3   meloxicam (MOBIC) 15 MG tablet, Take 15 mg by mouth daily., Disp: , Rfl:    montelukast (SINGULAIR) 10 MG tablet, Place 1 tablet (10 mg total) into feeding tube at bedtime., Disp: , Rfl:    montelukast (SINGULAIR) 10 MG tablet, TAKE ONE TABLET BY MOUTH AT BEDTIME, Disp: 30 tablet, Rfl: 0   ondansetron (ZOFRAN) 4 MG tablet, Take 1 tablet (4 mg total) by mouth every 8 (eight) hours as needed for nausea or vomiting., Disp: 20 tablet, Rfl: 0   rOPINIRole (REQUIP) 1 MG tablet, TAKE ONE TABLET BY MOUTH THREE TIMES DAILY, Disp: 30 tablet, Rfl: 5   SYMBICORT 160-4.5 MCG/ACT inhaler, INHALE TWO PUFFS INTO THE LUNGS TWICE DAILY., Disp: 1 Inhaler, Rfl: 11  Social History   Tobacco Use  Smoking Status Never  Smokeless Tobacco  Never    Allergies  Allergen Reactions   Cephalosporins     hives   Sulfa Antibiotics Hives   Objective:  There were no vitals filed for this visit. There is no height or weight on file to calculate BMI. Constitutional Well developed. Well nourished.  Vascular Foot warm and well perfused. Capillary refill normal to all digits.   Neurologic Normal speech. Oriented to person, place, and time. Epicritic sensation to light touch grossly present bilaterally.  Dermatologic Skin healing well without signs of infection. Skin edges well coapted without signs of infection.  Orthopedic: Tenderness to palpation noted about the surgical site.   Radiographs: Hardware intact and improved alignment of toe.   Assessment:   1. Status post surgery   2. Hallux valgus (acquired), left foot      Plan:  Patient was evaluated and treated and all questions answered.  S/p foot surgery left -Progressing as expected post-operatively. -Discussed swelling and using compression to help.  -WB Status: Continue WBAT in regular shoes.  -Discharged from surgical standpoint.   -discussed briefly right foot and if pain worsens in the future to return for surgical repair.    Return if symptoms worsen or fail to improve.

## 2023-08-06 DIAGNOSIS — L501 Idiopathic urticaria: Secondary | ICD-10-CM | POA: Diagnosis not present

## 2023-08-06 DIAGNOSIS — L509 Urticaria, unspecified: Secondary | ICD-10-CM | POA: Diagnosis not present

## 2023-08-21 ENCOUNTER — Encounter: Payer: Self-pay | Admitting: Podiatry

## 2023-08-22 ENCOUNTER — Other Ambulatory Visit: Payer: Self-pay | Admitting: Podiatry

## 2023-08-22 DIAGNOSIS — M2012 Hallux valgus (acquired), left foot: Secondary | ICD-10-CM

## 2023-08-22 DIAGNOSIS — Z9889 Other specified postprocedural states: Secondary | ICD-10-CM

## 2023-09-04 ENCOUNTER — Ambulatory Visit: Payer: Medicare PPO | Admitting: Physical Therapy

## 2023-09-04 ENCOUNTER — Encounter: Payer: Self-pay | Admitting: Physical Therapy

## 2023-09-04 DIAGNOSIS — M25572 Pain in left ankle and joints of left foot: Secondary | ICD-10-CM | POA: Diagnosis not present

## 2023-09-04 DIAGNOSIS — R262 Difficulty in walking, not elsewhere classified: Secondary | ICD-10-CM | POA: Diagnosis not present

## 2023-09-04 DIAGNOSIS — M6281 Muscle weakness (generalized): Secondary | ICD-10-CM | POA: Diagnosis not present

## 2023-09-04 NOTE — Therapy (Signed)
OUTPATIENT PHYSICAL THERAPY LOWER EXTREMITY EVALUATION   Patient Name: Kayla Henry MRN: [id number] DOB:27-Dec-1955, 67 y.o., female Today's Date: 09/04/2023  END OF SESSION:  PT End of Session - 09/04/23 1341     Visit Number 1    Number of Visits 16    Date for PT Re-Evaluation 11/27/23    Authorization Type humana - auth required    PT Start Time 1345    PT Stop Time 1426    PT Time Calculation (min) 41 min    Activity Tolerance Patient tolerated treatment well    Behavior During Therapy [facility] for tasks assessed/performed             Past Medical History:  Diagnosis Date   Allergy    Asthma    Chronic sinusitis    Diverticulosis    RLS (restless legs syndrome)    Past Surgical History:  Procedure Laterality Date   COLONOSCOPY     Dr. 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 05/06/2023   Insomnia 05/06/2023   Asthma 05/06/2023   Chronic idiopathic urticaria 06/27/2016   Acute respiratory failure (HCC)    Angioedema 07/21/2015   Arthritis 09/14/2014   Other allergic rhinitis 09/14/2014   Intrinsic asthma 08/31/2013   Left leg pain 12/15/2012   RLS (restless legs syndrome) 09/11/2011    PCP: Veryl Speak, MD  REFERRING PROVIDER: Louann Sjogren, DPM  REFERRING DIAG:  M20.12 (ICD-10-CM) - Hallux valgus (acquired), left foot Z98.890 (ICD-10-CM) - Status post surgery  THERAPY DIAG:  Pain in left ankle and joints of left foot  Difficulty in walking, not elsewhere classified  Muscle weakness (generalized)  Rationale for Evaluation and Treatment: Rehabilitation  ONSET DATE: DOS - 05/21/23 --- BUNIONECTOMY WITH OSTEOTOMY/ FUSION AT LEFT 1ST METATARSAL- MEDICAL CONEIFORM JOINT, POSSIBLE WEDGE OSTEOTOMY OF LEFT 1ST PROXIMAL PHALANX INTERNAL FIXATION TO BE USED  SUBJECTIVE:   SUBJECTIVE STATEMENT: Patient reported she had pain in her toe for a while which  resulted in her opting to have a bunion correction surgery.  Since the surgery she has had increased pain with movement and swelling in her foot.  She also has hypersensitivity along the top of her foot where the scar is.  Reports she was nonweightbearing for a couple weeks then in a cam boot for a few more weeks and now is weightbearing as tolerated in a regular shoe.  Reports she swims with no limitations.  She likes to walk but can only walk up to half a mile due to pain and previously she was walking up to 2 miles at a time.  She does not have pain at rest it is just with movement and especially with weightbearing and movement.  Reports she has to take stairs 1 at a time going down them due to pain.  PERTINENT HISTORY: asthma, angio edema PAIN:  Are you having pain? Yes: NPRS scale: 4/10 Pain location: left great toe Pain description: itchy, warm Aggravating factors: bending Relieving factors: rest  PRECAUTIONS: None  RED FLAGS: None   WEIGHT BEARING RESTRICTIONS: No  FALLS:  Has patient fallen in last 6 months? No  LIVING ENVIRONMENT: Lives with: lives with their spouse Lives in: House/apartment Stairs: Yes: Internal: 12 steps; on left going up and External: 2 steps; on left going up Has following equipment at home: None  OCCUPATION:  retried  swims - no  issues, limited with walking - was walking 2 miles a day and now can only walk 1/2 mile  PLOF: Independent  PATIENT GOALS: To have less pain and be able to walk without pain    OBJECTIVE:  Note: Objective measures were completed at Evaluation unless otherwise noted.  DIAGNOSTIC FINDINGS: regular imaging updates following post-op recovery  PATIENT SURVEYS:  FOTO 46%  COGNITION: Overall cognitive status: Within functional limits for tasks assessed     SENSATION: Not tested  EDEMA:  Circumferential: 21 cm L 20 R at ankle and Figure 8: L 49.0 and R 47.5cm    PALPATION: Hypersensitivity over scar from surgery on  left dorsal foot    LE Measurements Lower Extremity Right EVAL Left EVAL   A/PROM MMT A/PROM MMT  Hip Flexion      Hip Extension      Hip Abduction      Hip Adduction      Hip Internal rotation      Hip External rotation      Knee Flexion      Knee Extension      Ankle Dorsiflexion  4+  4-  Ankle Plantarflexion      Ankle Inversion  4+  4-  Ankle Eversion  4+  4-  Great Toe extension  4+  3+  Great Toe Flexion      Great toe adduction      Great Toe abduction       (Blank rows = not tested) * pain     GAIT: Distance walked: 25 ft in clinic in regular tennis shoe Assistive device utilized: None Level of assistance: Modified independence Comments: antalgic gait, favors non surgical leg, limited toe off- flat foot gait   TODAY'S TREATMENT:                                                                                                                              DATE:   09/04/2023  Therapeutic Exercise:  Aerobic: Supine: Prone:  Seated: Toe splaying x 10 bilaterally, great toe extension 2 minutes bilaterally, toe extension x 15, toe flexion x 15, heel raises x 15 bilaterally, self desensitization to scar 5 minutes  Standing: Neuromuscular Re-education: Manual Therapy: Therapeutic Activity: Self Care: Trigger Point Dry Needling:  Modalities:    PATIENT EDUCATION:  Education details: on current presentation, on HEP, on clinical outcomes score and POC, on use of toe spacer, on swelling and edema management with compression sock, on hypersensitivity along scar and desensitization strategies Person educated: Patient Education method: Explanation, Demonstration, and Handouts Education comprehension: verbalized understanding   HOME EXERCISE PROGRAM: [phone]  ASSESSMENT:  CLINICAL IMPRESSION: Patient presents to physical therapy status post left hallux valgus correction surgery.  Patient is over 3 months postop and continues to have pain and swelling in her  foot that limits her ability to walk and go up and down the stairs in her home.  Patient demonstrates swelling, range of  motion deficits, and strength deficits that are likely contributing to current presentation.  Session focused on education and initiation of home exercise program.  Patient would greatly benefit from skilled PT to improve gait mechanics and return patient to optimal function.  OBJECTIVE IMPAIRMENTS: Abnormal gait, decreased activity tolerance, decreased balance, decreased knowledge of use of DME, decreased mobility, difficulty walking, decreased ROM, decreased strength, increased edema, improper body mechanics, postural dysfunction, and pain.   ACTIVITY LIMITATIONS: standing, squatting, stairs, transfers, and locomotion level  PARTICIPATION LIMITATIONS: cleaning, shopping, and community activity  PERSONAL FACTORS: Age and Fitness are also affecting patient's functional outcome.   REHAB POTENTIAL: Good  CLINICAL DECISION MAKING: Stable/uncomplicated  EVALUATION COMPLEXITY: Low   GOALS: Goals reviewed with patient? yes  SHORT TERM GOALS: Target date: 10/16/2023 Patient will be independent in self management strategies to improve quality of life and functional outcomes. Baseline: New Program Goal status: INITIAL  2.  Patient will report at least 50% improvement in overall symptoms and/or function to demonstrate improved functional mobility Baseline: 0% better Goal status: INITIAL  3.  Patient will be able to acsend and descend the stairs with reciprocal gait pattern and use of railing as needed to improve ability to go up and down stairs in home. Baseline: Step to gait pattern Goal status: INITIAL    LONG TERM GOALS: Target date: 11/27/2023  Patient will report at least 75% improvement in overall symptoms and/or function to demonstrate improved functional mobility Baseline: 0% better Goal status: INITIAL  2.  Patient will improve score on FOTO outcomes measure  to projected score to demonstrate overall improved function and QOL Baseline: see above Goal status: INITIAL  3.  Patient will be able to walk 2 miles without pain to return to prior level of function. Baseline: Currently walking about half a mile Goal status: INITIAL  4.  Patient will demonstrate less than a half a centimeter of swelling difference between left and right foot to demonstrate improved swelling in feet. Baseline: See above Goal status: INITIAL    PLAN:  PT FREQUENCY: 1-2x/week for total of 16 visits over 12 weeks certification period  PT DURATION: 12 weeks  PLANNED INTERVENTIONS: 97110-Therapeutic exercises, 97530- Therapeutic activity, 97112- Neuromuscular re-education, [phone]- Self Care, 91478- Manual therapy, [phone]- Gait training, [phone]- Orthotic Fit/training, [phone]- Canalith repositioning, [id number]- Aquatic Therapy, 97014- Electrical stimulation (unattended), [phone]- Ionotophoresis 4mg /ml Dexamethasone, Patient/Family education, Balance training, Stair training, Taping, Dry Needling, Joint mobilization, Joint manipulation, Spinal manipulation, Spinal mobilization, Cryotherapy, and Moist heat  PLAN FOR NEXT SESSION: Desensitization of scar, gentle mobilization, soft tissue mobilization and edema management, mobility and weight shifts/balance as tolerated, toe and calf strength   3:52 PM, 09/04/23 Tereasa Coop, DPT Physical Therapy with [facility]

## 2023-09-11 ENCOUNTER — Ambulatory Visit: Payer: Medicare PPO | Admitting: Physical Therapy

## 2023-09-11 ENCOUNTER — Encounter: Payer: Self-pay | Admitting: Physical Therapy

## 2023-09-11 DIAGNOSIS — R262 Difficulty in walking, not elsewhere classified: Secondary | ICD-10-CM | POA: Diagnosis not present

## 2023-09-11 DIAGNOSIS — M25572 Pain in left ankle and joints of left foot: Secondary | ICD-10-CM

## 2023-09-11 DIAGNOSIS — M6281 Muscle weakness (generalized): Secondary | ICD-10-CM

## 2023-09-11 NOTE — Therapy (Signed)
OUTPATIENT PHYSICAL THERAPY LOWER EXTREMITY TREATMENT   Patient Name: Kayla Henry MRN: [id number] DOB:1955-12-24, 67 y.o., female Today's Date: 09/11/2023  END OF SESSION:  PT End of Session - 09/11/23 1255     Visit Number 2    Number of Visits 16    Date for PT Re-Evaluation 11/27/23    Authorization Type humana - auth required    PT Start Time 1302    PT Stop Time 1342    PT Time Calculation (min) 40 min    Activity Tolerance Patient tolerated treatment well    Behavior During Therapy [facility] for tasks assessed/performed             Past Medical History:  Diagnosis Date   Allergy    Asthma    Chronic sinusitis    Diverticulosis    RLS (restless legs syndrome)    Past Surgical History:  Procedure Laterality Date   COLONOSCOPY     Dr. 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 05/06/2023   Insomnia 05/06/2023   Asthma 05/06/2023   Chronic idiopathic urticaria 06/27/2016   Acute respiratory failure (HCC)    Angioedema 07/21/2015   Arthritis 09/14/2014   Other allergic rhinitis 09/14/2014   Intrinsic asthma 08/31/2013   Left leg pain 12/15/2012   RLS (restless legs syndrome) 09/11/2011    PCP: Veryl Speak, MD  REFERRING PROVIDER: Louann Sjogren, DPM  REFERRING DIAG:  M20.12 (ICD-10-CM) - Hallux valgus (acquired), left foot Z98.890 (ICD-10-CM) - Status post surgery  THERAPY DIAG:  Pain in left ankle and joints of left foot  Difficulty in walking, not elsewhere classified  Muscle weakness (generalized)  Rationale for Evaluation and Treatment: Rehabilitation  ONSET DATE: DOS - 05/21/23 --- BUNIONECTOMY WITH OSTEOTOMY/ FUSION AT LEFT 1ST METATARSAL- MEDICAL CONEIFORM JOINT, POSSIBLE WEDGE OSTEOTOMY OF LEFT 1ST PROXIMAL PHALANX INTERNAL FIXATION TO BE USED  SUBJECTIVE:   SUBJECTIVE STATEMENT: 09/11/2023 States she is doing OK. States she has been doing  her exercises.   EVAL: Patient reported she had pain in her toe for a while which resulted in her opting to have a bunion correction surgery.  Since the surgery she has had increased pain with movement and swelling in her foot.  She also has hypersensitivity along the top of her foot where the scar is.  Reports she was nonweightbearing for a couple weeks then in a cam boot for a few more weeks and now is weightbearing as tolerated in a regular shoe.  Reports she swims with no limitations.  She likes to walk but can only walk up to half a mile due to pain and previously she was walking up to 2 miles at a time.  She does not have pain at rest it is just with movement and especially with weightbearing and movement.  Reports she has to take stairs 1 at a time going down them due to pain.  PERTINENT HISTORY: asthma, angio edema PAIN:  Are you having pain? Yes: NPRS scale: 1/10 Pain location: left great toe Pain description: itchy, warm Aggravating factors: bending Relieving factors: rest  PRECAUTIONS: None  RED FLAGS: None   WEIGHT BEARING RESTRICTIONS: No  FALLS:  Has patient fallen in last 6 months? No  LIVING ENVIRONMENT: Lives with: lives with their spouse Lives in: House/apartment Stairs: Yes: Internal: 12 steps; on left going up and External: 2 steps; on left  going up Has following equipment at home: None  OCCUPATION:  retried  swims - no issues, limited with walking - was walking 2 miles a day and now can only walk 1/2 mile  PLOF: Independent  PATIENT GOALS: To have less pain and be able to walk without pain    OBJECTIVE:  Note: Objective measures were completed at Evaluation unless otherwise noted.  DIAGNOSTIC FINDINGS: regular imaging updates following post-op recovery  PATIENT SURVEYS:  FOTO 46%  COGNITION: Overall cognitive status: Within functional limits for tasks assessed     SENSATION: Not tested  EDEMA:  Circumferential: 21 cm L 20 R at ankle and Figure  8: L 49.0 and R 47.5cm    PALPATION: Hypersensitivity over scar from surgery on left dorsal foot    LE Measurements Lower Extremity Right EVAL Left EVAL   A/PROM MMT A/PROM MMT  Hip Flexion      Hip Extension      Hip Abduction      Hip Adduction      Hip Internal rotation      Hip External rotation      Knee Flexion      Knee Extension      Ankle Dorsiflexion  4+  4-  Ankle Plantarflexion      Ankle Inversion  4+  4-  Ankle Eversion  4+  4-  Great Toe extension  4+  3+  Great Toe Flexion      Great toe adduction      Great Toe abduction       (Blank rows = not tested) * pain     GAIT: Distance walked: 25 ft in clinic in regular tennis shoe Assistive device utilized: None Level of assistance: Modified independence Comments: antalgic gait, favors non surgical leg, limited toe off- flat foot gait   TODAY'S TREATMENT:                                                                                                                              DATE:   09/11/2023  Therapeutic Exercise:  Aerobic: Supine: Prone:  Seated: Left foot with large toe spacer:  Toe splaying x 10 bilaterally, great toe extension 2 minutes bilaterally, toe extension x 15,  DF x15, heel raises x 15 bilaterally,   Standing: weight shifts with toe spacer on floor and on mat - pain stooped, tried with low arch support - no change in symptoms.  Neuromuscular Re-education: Manual Therapy: joint mobilization to left great hallux- grade II- tolerated well - with and without PROM of toe into neutral and with extension  Therapeutic Activity: Self Care: Trigger Point Dry Needling:  Modalities:    PATIENT EDUCATION:  Education details: on HEP, ON WEARING MORE SUPPORTIVE SHOES WHILE STANDING AT HOME AND WALKING, on different types of arch supports and gel inserts Person educated: Patient Education method: Explanation, Demonstration, and Handouts Education comprehension: verbalized  understanding   HOME EXERCISE PROGRAM: [phone]  ASSESSMENT:  CLINICAL IMPRESSION: 09/11/2023 Continue to progress interventions as tolerated.  Improved proved great toe movement after prolonged stretching and gentle joint mobilization.  Continues to have pain with weight shifting no matter the surface.  Discussed walking mechanics and wearing more supportive shoes at this time to reduce pain and stress on toe.  Overall patient doing very well and provided patient with Thera-Band and home exercises for improved adherence to home exercise program.  Will continue with current plan of care as tolerated.  Eval: Patient presents to physical therapy status post left hallux valgus correction surgery.  Patient is over 3 months postop and continues to have pain and swelling in her foot that limits her ability to walk and go up and down the stairs in her home.  Patient demonstrates swelling, range of motion deficits, and strength deficits that are likely contributing to current presentation.  Session focused on education and initiation of home exercise program.  Patient would greatly benefit from skilled PT to improve gait mechanics and return patient to optimal function.  OBJECTIVE IMPAIRMENTS: Abnormal gait, decreased activity tolerance, decreased balance, decreased knowledge of use of DME, decreased mobility, difficulty walking, decreased ROM, decreased strength, increased edema, improper body mechanics, postural dysfunction, and pain.   ACTIVITY LIMITATIONS: standing, squatting, stairs, transfers, and locomotion level  PARTICIPATION LIMITATIONS: cleaning, shopping, and community activity  PERSONAL FACTORS: Age and Fitness are also affecting patient's functional outcome.   REHAB POTENTIAL: Good  CLINICAL DECISION MAKING: Stable/uncomplicated  EVALUATION COMPLEXITY: Low   GOALS: Goals reviewed with patient? yes  SHORT TERM GOALS: Target date: 10/16/2023 Patient will be independent in self  management strategies to improve quality of life and functional outcomes. Baseline: New Program Goal status: INITIAL  2.  Patient will report at least 50% improvement in overall symptoms and/or function to demonstrate improved functional mobility Baseline: 0% better Goal status: INITIAL  3.  Patient will be able to acsend and descend the stairs with reciprocal gait pattern and use of railing as needed to improve ability to go up and down stairs in home. Baseline: Step to gait pattern Goal status: INITIAL    LONG TERM GOALS: Target date: 11/27/2023  Patient will report at least 75% improvement in overall symptoms and/or function to demonstrate improved functional mobility Baseline: 0% better Goal status: INITIAL  2.  Patient will improve score on FOTO outcomes measure to projected score to demonstrate overall improved function and QOL Baseline: see above Goal status: INITIAL  3.  Patient will be able to walk 2 miles without pain to return to prior level of function. Baseline: Currently walking about half a mile Goal status: INITIAL  4.  Patient will demonstrate less than a half a centimeter of swelling difference between left and right foot to demonstrate improved swelling in feet. Baseline: See above Goal status: INITIAL    PLAN:  PT FREQUENCY: 1-2x/week for total of 16 visits over 12 weeks certification period  PT DURATION: 12 weeks  PLANNED INTERVENTIONS: 97110-Therapeutic exercises, 97530- Therapeutic activity, 97112- Neuromuscular re-education, 97535- Self Care, [id number]- Manual therapy, [phone]- Gait training, [phone]- Orthotic Fit/training, [phone]- Canalith repositioning, [id number]- Aquatic Therapy, 97014- Electrical stimulation (unattended), [phone]- Ionotophoresis 4mg /ml Dexamethasone, Patient/Family education, Balance training, Stair training, Taping, Dry Needling, Joint mobilization, Joint manipulation, Spinal manipulation, Spinal mobilization, Cryotherapy, and Moist heat  PLAN  FOR NEXT SESSION: Desensitization of scar, gentle mobilization, soft tissue mobilization and edema management, mobility and weight shifts/balance as tolerated, toe and calf strength   1:47 PM, 09/11/23 Elon Jester  Loletha Grayer, DPT Physical Therapy with [place]

## 2023-09-17 DIAGNOSIS — L509 Urticaria, unspecified: Secondary | ICD-10-CM | POA: Diagnosis not present

## 2023-09-17 DIAGNOSIS — L501 Idiopathic urticaria: Secondary | ICD-10-CM | POA: Diagnosis not present

## 2023-09-18 ENCOUNTER — Encounter: Payer: Self-pay | Admitting: Physical Therapy

## 2023-09-18 ENCOUNTER — Ambulatory Visit: Payer: Medicare PPO | Admitting: Physical Therapy

## 2023-09-18 DIAGNOSIS — R262 Difficulty in walking, not elsewhere classified: Secondary | ICD-10-CM | POA: Diagnosis not present

## 2023-09-18 DIAGNOSIS — M25572 Pain in left ankle and joints of left foot: Secondary | ICD-10-CM

## 2023-09-18 NOTE — Therapy (Signed)
OUTPATIENT PHYSICAL THERAPY LOWER EXTREMITY TREATMENT   Patient Name: Kayla Henry MRN: [id number] DOB:02/11/1956, 67 y.o., female Today's Date: 09/18/2023  END OF SESSION:  PT End of Session - 09/18/23 1302     Visit Number 3    Number of Visits 16    Date for PT Re-Evaluation 11/27/23    Authorization Type humana - auth required    PT Start Time 1304    PT Stop Time 1342    PT Time Calculation (min) 38 min    Activity Tolerance Patient tolerated treatment well    Behavior During Therapy WFL for tasks assessed/performed             Past Medical History:  Diagnosis Date   Allergy    Asthma    Chronic sinusitis    Diverticulosis    RLS (restless legs syndrome)    Past Surgical History:  Procedure Laterality Date   COLONOSCOPY     Dr. 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 05/06/2023   Insomnia 05/06/2023   Asthma 05/06/2023   Chronic idiopathic urticaria 06/27/2016   Acute respiratory failure (HCC)    Angioedema 07/21/2015   Arthritis 09/14/2014   Other allergic rhinitis 09/14/2014   Intrinsic asthma 08/31/2013   Left leg pain 12/15/2012   RLS (restless legs syndrome) 09/11/2011    PCP: Veryl Speak, MD  REFERRING PROVIDER: Louann Sjogren, DPM  REFERRING DIAG:  M20.12 (ICD-10-CM) - Hallux valgus (acquired), left foot Z98.890 (ICD-10-CM) - Status post surgery  THERAPY DIAG:  Pain in left ankle and joints of left foot  Difficulty in walking, not elsewhere classified  Rationale for Evaluation and Treatment: Rehabilitation  ONSET DATE: DOS - 05/21/23 --- BUNIONECTOMY WITH OSTEOTOMY/ FUSION AT LEFT 1ST METATARSAL- MEDICAL CONEIFORM JOINT, POSSIBLE WEDGE OSTEOTOMY OF LEFT 1ST PROXIMAL PHALANX INTERNAL FIXATION TO BE USED  SUBJECTIVE:   SUBJECTIVE STATEMENT: 10/30/2024States she has some pain after her walk this morning, bene using the toe spacer and  doing the exercises and also got inserts.  EVAL: Patient reported she had pain in her toe for a while which resulted in her opting to have a bunion correction surgery.  Since the surgery she has had increased pain with movement and swelling in her foot.  She also has hypersensitivity along the top of her foot where the scar is.  Reports she was nonweightbearing for a couple weeks then in a cam boot for a few more weeks and now is weightbearing as tolerated in a regular shoe.  Reports she swims with no limitations.  She likes to walk but can only walk up to half a mile due to pain and previously she was walking up to 2 miles at a time.  She does not have pain at rest it is just with movement and especially with weightbearing and movement.  Reports she has to take stairs 1 at a time going down them due to pain.  PERTINENT HISTORY: asthma, angio edema PAIN:  Are you having pain? Yes: NPRS scale: 3/10 Pain location: left great toe Pain description: achy Aggravating factors: bending Relieving factors: rest  PRECAUTIONS: None  RED FLAGS: None   WEIGHT BEARING RESTRICTIONS: No  FALLS:  Has patient fallen in last 6 months? No  LIVING ENVIRONMENT: Lives with: lives with their spouse Lives in: House/apartment Stairs: Yes: Internal: 12 steps; on left going up and External:  2 steps; on left going up Has following equipment at home: None  OCCUPATION:  retried  swims - no issues, limited with walking - was walking 2 miles a day and now can only walk 1/2 mile  PLOF: Independent  PATIENT GOALS: To have less pain and be able to walk without pain    OBJECTIVE:  Note: Objective measures were completed at Evaluation unless otherwise noted.  DIAGNOSTIC FINDINGS: regular imaging updates following post-op recovery  PATIENT SURVEYS:  FOTO 46%  COGNITION: Overall cognitive status: Within functional limits for tasks assessed     SENSATION: Not tested  EDEMA:  Circumferential: 21 cm L 20 R  at ankle and Figure 8: L 49.0 and R 47.5cm    PALPATION: Hypersensitivity over scar from surgery on left dorsal foot    LE Measurements Lower Extremity Right EVAL Left EVAL   A/PROM MMT A/PROM MMT  Hip Flexion      Hip Extension      Hip Abduction      Hip Adduction      Hip Internal rotation      Hip External rotation      Knee Flexion      Knee Extension      Ankle Dorsiflexion  4+  4-  Ankle Plantarflexion      Ankle Inversion  4+  4-  Ankle Eversion  4+  4-  Great Toe extension  4+  3+  Great Toe Flexion      Great toe adduction      Great Toe abduction       (Blank rows = not tested) * pain     GAIT: Distance walked: 25 ft in clinic in regular tennis shoe Assistive device utilized: None Level of assistance: Modified independence Comments: antalgic gait, favors non surgical leg, limited toe off- flat foot gait   TODAY'S TREATMENT:                                                                                                                              DATE:   09/18/2023  Therapeutic Exercise:  Aerobic: Supine: Prone:  Seated: Left foot with large toe spacer:  great toe extension 2 minutes bilaterally, toe extension x 15,  DF x15, heel raises x 15 bilaterally, weight shifts with toe spacer x20 10" hlds L, toe space with rubber band and DF x15 L   Standing:   Neuromuscular Re-education: Manual Therapy: vibration to left foot- pain reduced afterwards  12 minutes, midfoot and distal left foot mobilizations to promote eversion grade II/III - tolerated well - 8 minutes Therapeutic Activity: Self Care: Trigger Point Dry Needling:  Modalities:    PATIENT EDUCATION:  Education details: on safe use of percussion gun for vibration Person educated: Patient Education method: Explanation, Demonstration, and Handouts Education comprehension: verbalized understanding   HOME EXERCISE PROGRAM: [phone]  ASSESSMENT:  CLINICAL  IMPRESSION: 09/18/2023 Continued to progress exercises. Tolerated vibration well with reduced pain  afterwards. Overall patient doing well with therapy and improved weight shifts and toe positioning noted at rest and with interventions. Educated patient on safe use of percussion gun with vibration gun and padding to help with pain.  Eval: Patient presents to physical therapy status post left hallux valgus correction surgery.  Patient is over 3 months postop and continues to have pain and swelling in her foot that limits her ability to walk and go up and down the stairs in her home.  Patient demonstrates swelling, range of motion deficits, and strength deficits that are likely contributing to current presentation.  Session focused on education and initiation of home exercise program.  Patient would greatly benefit from skilled PT to improve gait mechanics and return patient to optimal function.  OBJECTIVE IMPAIRMENTS: Abnormal gait, decreased activity tolerance, decreased balance, decreased knowledge of use of DME, decreased mobility, difficulty walking, decreased ROM, decreased strength, increased edema, improper body mechanics, postural dysfunction, and pain.   ACTIVITY LIMITATIONS: standing, squatting, stairs, transfers, and locomotion level  PARTICIPATION LIMITATIONS: cleaning, shopping, and community activity  PERSONAL FACTORS: Age and Fitness are also affecting patient's functional outcome.   REHAB POTENTIAL: Good  CLINICAL DECISION MAKING: Stable/uncomplicated  EVALUATION COMPLEXITY: Low   GOALS: Goals reviewed with patient? yes  SHORT TERM GOALS: Target date: 10/16/2023 Patient will be independent in self management strategies to improve quality of life and functional outcomes. Baseline: New Program Goal status: INITIAL  2.  Patient will report at least 50% improvement in overall symptoms and/or function to demonstrate improved functional mobility Baseline: 0% better Goal status:  INITIAL  3.  Patient will be able to acsend and descend the stairs with reciprocal gait pattern and use of railing as needed to improve ability to go up and down stairs in home. Baseline: Step to gait pattern Goal status: INITIAL    LONG TERM GOALS: Target date: 11/27/2023  Patient will report at least 75% improvement in overall symptoms and/or function to demonstrate improved functional mobility Baseline: 0% better Goal status: INITIAL  2.  Patient will improve score on FOTO outcomes measure to projected score to demonstrate overall improved function and QOL Baseline: see above Goal status: INITIAL  3.  Patient will be able to walk 2 miles without pain to return to prior level of function. Baseline: Currently walking about half a mile Goal status: INITIAL  4.  Patient will demonstrate less than a half a centimeter of swelling difference between left and right foot to demonstrate improved swelling in feet. Baseline: See above Goal status: INITIAL    PLAN:  PT FREQUENCY: 1-2x/week for total of 16 visits over 12 weeks certification period  PT DURATION: 12 weeks  PLANNED INTERVENTIONS: 97110-Therapeutic exercises, 97530- Therapeutic activity, 97112- Neuromuscular re-education, 97535- Self Care, 29562- Manual therapy, [phone]- Gait training, [phone]- Orthotic Fit/training, [phone]- Canalith repositioning, [id number]- Aquatic Therapy, 97014- Electrical stimulation (unattended), [phone]- Ionotophoresis 4mg /ml Dexamethasone, Patient/Family education, Balance training, Stair training, Taping, Dry Needling, Joint mobilization, Joint manipulation, Spinal manipulation, Spinal mobilization, Cryotherapy, and Moist heat  PLAN FOR NEXT SESSION: Desensitization of scar, gentle mobilization, soft tissue mobilization and edema management, mobility and weight shifts/balance as tolerated, toe and calf strength   1:42 PM, 09/18/23 Tereasa Coop, DPT Physical Therapy with [facility]

## 2023-09-25 ENCOUNTER — Encounter: Payer: Self-pay | Admitting: Physical Therapy

## 2023-09-25 ENCOUNTER — Ambulatory Visit: Payer: Medicare PPO | Admitting: Physical Therapy

## 2023-09-25 DIAGNOSIS — R262 Difficulty in walking, not elsewhere classified: Secondary | ICD-10-CM

## 2023-09-25 DIAGNOSIS — M6281 Muscle weakness (generalized): Secondary | ICD-10-CM | POA: Diagnosis not present

## 2023-09-25 DIAGNOSIS — M25572 Pain in left ankle and joints of left foot: Secondary | ICD-10-CM

## 2023-09-25 NOTE — Therapy (Signed)
OUTPATIENT PHYSICAL THERAPY LOWER EXTREMITY TREATMENT   Patient Name: Kayla Henry MRN: [id number] DOB:1956-10-24, 67 y.o., female Today's Date: 09/25/2023  END OF SESSION:  PT End of Session - 09/25/23 1250     Visit Number 4    Number of Visits 16    Date for PT Re-Evaluation 11/27/23    Authorization Type humana - auth required    PT Start Time 1256    PT Stop Time 1335    PT Time Calculation (min) 39 min    Activity Tolerance Patient tolerated treatment well    Behavior During Therapy [facility] for tasks assessed/performed              Past Medical History:  Diagnosis Date   Allergy    Asthma    Chronic sinusitis    Diverticulosis    RLS (restless legs syndrome)    Past Surgical History:  Procedure Laterality Date   COLONOSCOPY     Dr. 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 05/06/2023   Insomnia 05/06/2023   Asthma 05/06/2023   Chronic idiopathic urticaria 06/27/2016   Acute respiratory failure (HCC)    Angioedema 07/21/2015   Arthritis 09/14/2014   Other allergic rhinitis 09/14/2014   Intrinsic asthma 08/31/2013   Left leg pain 12/15/2012   RLS (restless legs syndrome) 09/11/2011    PCP: Veryl Speak, MD  REFERRING PROVIDER: Louann Sjogren, DPM  REFERRING DIAG:  M20.12 (ICD-10-CM) - Hallux valgus (acquired), left foot Z98.890 (ICD-10-CM) - Status post surgery  THERAPY DIAG:  Pain in left ankle and joints of left foot  Difficulty in walking, not elsewhere classified  Muscle weakness (generalized)  Rationale for Evaluation and Treatment: Rehabilitation  ONSET DATE: DOS - 05/21/23 --- BUNIONECTOMY WITH OSTEOTOMY/ FUSION AT LEFT 1ST METATARSAL- MEDICAL CONEIFORM JOINT, POSSIBLE WEDGE OSTEOTOMY OF LEFT 1ST PROXIMAL PHALANX INTERNAL FIXATION TO BE USED  SUBJECTIVE:   SUBJECTIVE STATEMENT: 09/25/2023  Foot is feeling some better. I've been able to  walk longer without as much pain. Can get to 3-4/10 in the big toe when I go to push off during walking. The pain can travel up the front/outside of my shin more on the outside like a throb. Scar is better but still sensitive.   EVAL: Patient reported she had pain in her toe for a while which resulted in her opting to have a bunion correction surgery.  Since the surgery she has had increased pain with movement and swelling in her foot.  She also has hypersensitivity along the top of her foot where the scar is.  Reports she was nonweightbearing for a couple weeks then in a cam boot for a few more weeks and now is weightbearing as tolerated in a regular shoe.  Reports she swims with no limitations.  She likes to walk but can only walk up to half a mile due to pain and previously she was walking up to 2 miles at a time.  She does not have pain at rest it is just with movement and especially with weightbearing and movement.  Reports she has to take stairs 1 at a time going down them due to pain.  PERTINENT HISTORY: asthma, angio edema PAIN:  Are you having pain? Yes: NPRS scale: 2/10 Pain location: left great toe and outside of L shin Pain description: achy, throbbing  Aggravating factors: pushing off toe to walk  during gait Relieving factors: rest  PRECAUTIONS: None  RED FLAGS: None   WEIGHT BEARING RESTRICTIONS: No  FALLS:  Has patient fallen in last 6 months? No  LIVING ENVIRONMENT: Lives with: lives with their spouse Lives in: House/apartment Stairs: Yes: Internal: 12 steps; on left going up and External: 2 steps; on left going up Has following equipment at home: None  OCCUPATION:  retried  swims - no issues, limited with walking - was walking 2 miles a day and now can only walk 1/2 mile  PLOF: Independent  PATIENT GOALS: To have less pain and be able to walk without pain    OBJECTIVE:  Note: Objective measures were completed at Evaluation unless otherwise noted.  DIAGNOSTIC  FINDINGS: regular imaging updates following post-op recovery  PATIENT SURVEYS:  FOTO 46%  COGNITION: Overall cognitive status: Within functional limits for tasks assessed     SENSATION: Not tested  EDEMA:  Circumferential: 21 cm L 20 R at ankle and Figure 8: L 49.0 and R 47.5cm    PALPATION: Hypersensitivity over scar from surgery on left dorsal foot    LE Measurements Lower Extremity Right EVAL Left EVAL   A/PROM MMT A/PROM MMT  Hip Flexion      Hip Extension      Hip Abduction      Hip Adduction      Hip Internal rotation      Hip External rotation      Knee Flexion      Knee Extension      Ankle Dorsiflexion  4+  4-  Ankle Plantarflexion      Ankle Inversion  4+  4-  Ankle Eversion  4+  4-  Great Toe extension  4+  3+  Great Toe Flexion      Great toe adduction      Great Toe abduction       (Blank rows = not tested) * pain     GAIT: Distance walked: 25 ft in clinic in regular tennis shoe Assistive device utilized: None Level of assistance: Modified independence Comments: antalgic gait, favors non surgical leg, limited toe off- flat foot gait   TODAY'S TREATMENT:                                                                                                                              DATE:   09/25/2023  Therapeutic Exercise:  Aerobic: Supine: Prone:  Seated:  attempted towel crunches, unable to get towel fully crunched but tolerated activity for toe flexion ROM for 3-4 minutes, great toe extensions x15, ankle alphabets x3 rounds for ankle ROM   Standing: plantar fascia stretches off step 3x30 seconds, heel walks x33ft, attempted toe walks but unable, heel raises with focus on good toe extension x10 Neuromuscular Re-education: Manual Therapy:  distal scar mobilization, 1st metatarsal mobs for great toe extension, great toe extension stretch 3x30 seconds, ankle dorsiflexion stretch 3x30 seconds, ankle DF grade III mobs,  foot inversion/eversion mobs  grade III  Therapeutic Activity: Self Care: Trigger Point Dry Needling:  Modalities:        PATIENT EDUCATION:  Education details: on safe use of percussion gun for vibration Person educated: Patient Education method: Explanation, Demonstration, and Handouts Education comprehension: verbalized understanding   HOME EXERCISE PROGRAM: [phone]  ASSESSMENT:  CLINICAL IMPRESSION: 09/25/2023  Pt arrives doing alright today, having similar levels of pain in post-op foot but now some pain that's moved up to the lateral calf muscles L LE likely due to post-surgical gait impairments. Continued with STM and functional exercises as tolerated today, ankle seemed a bit stiff too so introduced some appropriate ROM exercises. May benefit from DN to calf musculature next visit.    Eval: Patient presents to physical therapy status post left hallux valgus correction surgery.  Patient is over 3 months postop and continues to have pain and swelling in her foot that limits her ability to walk and go up and down the stairs in her home.  Patient demonstrates swelling, range of motion deficits, and strength deficits that are likely contributing to current presentation.  Session focused on education and initiation of home exercise program.  Patient would greatly benefit from skilled PT to improve gait mechanics and return patient to optimal function.  OBJECTIVE IMPAIRMENTS: Abnormal gait, decreased activity tolerance, decreased balance, decreased knowledge of use of DME, decreased mobility, difficulty walking, decreased ROM, decreased strength, increased edema, improper body mechanics, postural dysfunction, and pain.   ACTIVITY LIMITATIONS: standing, squatting, stairs, transfers, and locomotion level  PARTICIPATION LIMITATIONS: cleaning, shopping, and community activity  PERSONAL FACTORS: Age and Fitness are also affecting patient's functional outcome.   REHAB POTENTIAL: Good  CLINICAL DECISION MAKING:  Stable/uncomplicated  EVALUATION COMPLEXITY: Low   GOALS: Goals reviewed with patient? yes  SHORT TERM GOALS: Target date: 10/16/2023 Patient will be independent in self management strategies to improve quality of life and functional outcomes. Baseline: New Program Goal status: INITIAL  2.  Patient will report at least 50% improvement in overall symptoms and/or function to demonstrate improved functional mobility Baseline: 0% better Goal status: INITIAL  3.  Patient will be able to acsend and descend the stairs with reciprocal gait pattern and use of railing as needed to improve ability to go up and down stairs in home. Baseline: Step to gait pattern Goal status: INITIAL    LONG TERM GOALS: Target date: 11/27/2023  Patient will report at least 75% improvement in overall symptoms and/or function to demonstrate improved functional mobility Baseline: 0% better Goal status: INITIAL  2.  Patient will improve score on FOTO outcomes measure to projected score to demonstrate overall improved function and QOL Baseline: see above Goal status: INITIAL  3.  Patient will be able to walk 2 miles without pain to return to prior level of function. Baseline: Currently walking about half a mile Goal status: INITIAL  4.  Patient will demonstrate less than a half a centimeter of swelling difference between left and right foot to demonstrate improved swelling in feet. Baseline: See above Goal status: INITIAL    PLAN:  PT FREQUENCY: 1-2x/week for total of 16 visits over 12 weeks certification period  PT DURATION: 12 weeks  PLANNED INTERVENTIONS: 97110-Therapeutic exercises, 97530- Therapeutic activity, 97112- Neuromuscular re-education, 97535- Self Care, 84696- Manual therapy, [phone]- Gait training, [phone]- Orthotic Fit/training, [phone]- Canalith repositioning, [id number]- Aquatic Therapy, 97014- Electrical stimulation (unattended), [phone]- Ionotophoresis 4mg /ml Dexamethasone, Patient/Family  education, Balance training, Stair training, Taping, Dry Needling, Joint mobilization,  Joint manipulation, Spinal manipulation, Spinal mobilization, Cryotherapy, and Moist heat  PLAN FOR NEXT SESSION: Desensitization of scar, gentle mobilization, soft tissue mobilization and edema management, mobility and weight shifts/balance as tolerated, toe and calf strength. DN lateral L calf?    Nedra Hai, PT, DPT 09/25/23 1:36 PM

## 2023-10-02 ENCOUNTER — Encounter: Payer: Self-pay | Admitting: Physical Therapy

## 2023-10-02 ENCOUNTER — Ambulatory Visit: Payer: Medicare PPO | Admitting: Physical Therapy

## 2023-10-02 DIAGNOSIS — R262 Difficulty in walking, not elsewhere classified: Secondary | ICD-10-CM | POA: Diagnosis not present

## 2023-10-02 DIAGNOSIS — M25572 Pain in left ankle and joints of left foot: Secondary | ICD-10-CM

## 2023-10-02 DIAGNOSIS — M6281 Muscle weakness (generalized): Secondary | ICD-10-CM

## 2023-10-02 NOTE — Therapy (Signed)
OUTPATIENT PHYSICAL THERAPY LOWER EXTREMITY TREATMENT   Patient Name: Kayla Henry MRN: [id number] DOB:Mar 02, 1956, 67 y.o., female Today's Date: 10/02/2023  END OF SESSION:  PT End of Session - 10/02/23 1301     Visit Number 5    Number of Visits 16    Date for PT Re-Evaluation 11/27/23    Authorization Type humana - auth approved 12 visits approved from 09/04/2023 - 11/19/2023    Authorization - Visit Number 5    Authorization - Number of Visits 12    PT Start Time 1302    PT Stop Time 1340    PT Time Calculation (min) 38 min    Activity Tolerance Patient tolerated treatment well    Behavior During Therapy WFL for tasks assessed/performed              Past Medical History:  Diagnosis Date   Allergy    Asthma    Chronic sinusitis    Diverticulosis    RLS (restless legs syndrome)    Past Surgical History:  Procedure Laterality Date   COLONOSCOPY     Dr. 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 05/06/2023   Insomnia 05/06/2023   Asthma 05/06/2023   Chronic idiopathic urticaria 06/27/2016   Acute respiratory failure (HCC)    Angioedema 07/21/2015   Arthritis 09/14/2014   Other allergic rhinitis 09/14/2014   Intrinsic asthma 08/31/2013   Left leg pain 12/15/2012   RLS (restless legs syndrome) 09/11/2011    PCP: Veryl Speak, MD  REFERRING PROVIDER: Louann Sjogren, DPM  REFERRING DIAG:  M20.12 (ICD-10-CM) - Hallux valgus (acquired), left foot Z98.890 (ICD-10-CM) - Status post surgery  THERAPY DIAG:  Pain in left ankle and joints of left foot  Difficulty in walking, not elsewhere classified  Muscle weakness (generalized)  Rationale for Evaluation and Treatment: Rehabilitation  ONSET DATE: DOS - 05/21/23 --- BUNIONECTOMY WITH OSTEOTOMY/ FUSION AT LEFT 1ST METATARSAL- MEDICAL CONEIFORM JOINT, POSSIBLE WEDGE OSTEOTOMY OF LEFT 1ST PROXIMAL PHALANX INTERNAL  FIXATION TO BE USED  SUBJECTIVE:   SUBJECTIVE STATEMENT: 10/02/2023 States that she has good and bad days and the foot throbs a lot on the bad days and its just her regular movement. States exercises are still about the same. Walking is still painful.  EVAL: Patient reported she had pain in her toe for a while which resulted in her opting to have a bunion correction surgery.  Since the surgery she has had increased pain with movement and swelling in her foot.  She also has hypersensitivity along the top of her foot where the scar is.  Reports she was nonweightbearing for a couple weeks then in a cam boot for a few more weeks and now is weightbearing as tolerated in a regular shoe.  Reports she swims with no limitations.  She likes to walk but can only walk up to half a mile due to pain and previously she was walking up to 2 miles at a time.  She does not have pain at rest it is just with movement and especially with weightbearing and movement.  Reports she has to take stairs 1 at a time going down them due to pain.  PERTINENT HISTORY: asthma, angio edema PAIN:  Are you having pain? Yes: NPRS scale: 0/10 Pain location: left great toe and outside of L shin Pain description: achy, throbbing  Aggravating factors: pushing off  toe to walk during gait Relieving factors: rest  PRECAUTIONS: None  RED FLAGS: None   WEIGHT BEARING RESTRICTIONS: No  FALLS:  Has patient fallen in last 6 months? No  LIVING ENVIRONMENT: Lives with: lives with their spouse Lives in: House/apartment Stairs: Yes: Internal: 12 steps; on left going up and External: 2 steps; on left going up Has following equipment at home: None  OCCUPATION:  retried  swims - no issues, limited with walking - was walking 2 miles a day and now can only walk 1/2 mile  PLOF: Independent  PATIENT GOALS: To have less pain and be able to walk without pain    OBJECTIVE:  Note: Objective measures were completed at Evaluation unless  otherwise noted.  DIAGNOSTIC FINDINGS: regular imaging updates following post-op recovery  PATIENT SURVEYS:  FOTO 46%  COGNITION: Overall cognitive status: Within functional limits for tasks assessed     SENSATION: Not tested  EDEMA:  Circumferential: 21 cm L 20 R at ankle and Figure 8: L 49.0 and R 47.5cm    PALPATION: Hypersensitivity over scar from surgery on left dorsal foot    LE Measurements Lower Extremity Right EVAL Left EVAL   A/PROM MMT A/PROM MMT  Hip Flexion      Hip Extension      Hip Abduction      Hip Adduction      Hip Internal rotation      Hip External rotation      Knee Flexion      Knee Extension      Ankle Dorsiflexion  4+  4-  Ankle Plantarflexion      Ankle Inversion  4+  4-  Ankle Eversion  4+  4-  Great Toe extension  4+  3+  Great Toe Flexion      Great toe adduction      Great Toe abduction       (Blank rows = not tested) * pain     GAIT: Distance walked: 25 ft in clinic in regular tennis shoe Assistive device utilized: None Level of assistance: Modified independence Comments: antalgic gait, favors non surgical leg, limited toe off- flat foot gait   TODAY'S TREATMENT:                                                                                                                              DATE:   10/02/2023  Therapeutic Exercise:  Aerobic: Supine: Prone:  Seated:  ant tib stretch L x5 15" holds, toe ext great toe x15 5" holds B, DF/PF x15 5" holds B, self mobilization with tiger tail 2 minutes     Standing:   Neuromuscular Re-education: tandem on blue foam x3 30" holds B, tandem on floor with head turns 3x5 B slow and controlled, narrow BOS on foam EC x5 15" holds, Narrow BOS on foam EO body turns 3x5 B Manual Therapy:  percussion gun to left foot/ant tib and calf - tolerated  well. Therapeutic Activity: Self Care: Trigger Point Dry Needling:  Modalities:        PATIENT EDUCATION:  Education details: on   HEP Person educated: Patient Education method: Explanation, Demonstration, and Handouts Education comprehension: verbalized understanding   HOME EXERCISE PROGRAM: [phone]  ASSESSMENT:  CLINICAL IMPRESSION: 10/02/2023 Continues to have swelling and pain though activity is increasing overall. Continued to progress exercises which were tolerated well. Discussed welling post op. Patient to f/u with MD secondary to continued swelling and pain. Overall patient doing well and will continue to benefit from skilled PT at this time.    Eval: Patient presents to physical therapy status post left hallux valgus correction surgery.  Patient is over 3 months postop and continues to have pain and swelling in her foot that limits her ability to walk and go up and down the stairs in her home.  Patient demonstrates swelling, range of motion deficits, and strength deficits that are likely contributing to current presentation.  Session focused on education and initiation of home exercise program.  Patient would greatly benefit from skilled PT to improve gait mechanics and return patient to optimal function.  OBJECTIVE IMPAIRMENTS: Abnormal gait, decreased activity tolerance, decreased balance, decreased knowledge of use of DME, decreased mobility, difficulty walking, decreased ROM, decreased strength, increased edema, improper body mechanics, postural dysfunction, and pain.   ACTIVITY LIMITATIONS: standing, squatting, stairs, transfers, and locomotion level  PARTICIPATION LIMITATIONS: cleaning, shopping, and community activity  PERSONAL FACTORS: Age and Fitness are also affecting patient's functional outcome.   REHAB POTENTIAL: Good  CLINICAL DECISION MAKING: Stable/uncomplicated  EVALUATION COMPLEXITY: Low   GOALS: Goals reviewed with patient? yes  SHORT TERM GOALS: Target date: 10/16/2023 Patient will be independent in self management strategies to improve quality of life and functional  outcomes. Baseline: New Program Goal status: INITIAL  2.  Patient will report at least 50% improvement in overall symptoms and/or function to demonstrate improved functional mobility Baseline: 0% better Goal status: INITIAL  3.  Patient will be able to acsend and descend the stairs with reciprocal gait pattern and use of railing as needed to improve ability to go up and down stairs in home. Baseline: Step to gait pattern Goal status: INITIAL    LONG TERM GOALS: Target date: 11/27/2023  Patient will report at least 75% improvement in overall symptoms and/or function to demonstrate improved functional mobility Baseline: 0% better Goal status: INITIAL  2.  Patient will improve score on FOTO outcomes measure to projected score to demonstrate overall improved function and QOL Baseline: see above Goal status: INITIAL  3.  Patient will be able to walk 2 miles without pain to return to prior level of function. Baseline: Currently walking about half a mile Goal status: INITIAL  4.  Patient will demonstrate less than a half a centimeter of swelling difference between left and right foot to demonstrate improved swelling in feet. Baseline: See above Goal status: INITIAL    PLAN:  PT FREQUENCY: 1-2x/week for total of 16 visits over 12 weeks certification period  PT DURATION: 12 weeks  PLANNED INTERVENTIONS: 97110-Therapeutic exercises, 97530- Therapeutic activity, 97112- Neuromuscular re-education, 97535- Self Care, 64403- Manual therapy, [phone]- Gait training, [phone]- Orthotic Fit/training, [phone]- Canalith repositioning, [id number]- Aquatic Therapy, 97014- Electrical stimulation (unattended), [phone]- Ionotophoresis 4mg /ml Dexamethasone, Patient/Family education, Balance training, Stair training, Taping, Dry Needling, Joint mobilization, Joint manipulation, Spinal manipulation, Spinal mobilization, Cryotherapy, and Moist heat  PLAN FOR NEXT SESSION: Desensitization of scar, gentle mobilization,  soft tissue mobilization and edema  management, mobility and weight shifts/balance as tolerated, toe and calf strength. DN lateral L calf?    1:46 PM, 10/02/23 Tereasa Coop, DPT Physical Therapy with [place]

## 2023-10-10 ENCOUNTER — Ambulatory Visit: Payer: Medicare PPO | Admitting: Podiatry

## 2023-10-10 ENCOUNTER — Encounter: Payer: Medicare PPO | Admitting: Physical Therapy

## 2023-10-23 ENCOUNTER — Ambulatory Visit: Payer: Medicare PPO | Admitting: Physical Therapy

## 2023-10-23 DIAGNOSIS — M6281 Muscle weakness (generalized): Secondary | ICD-10-CM | POA: Diagnosis not present

## 2023-10-23 DIAGNOSIS — R262 Difficulty in walking, not elsewhere classified: Secondary | ICD-10-CM | POA: Diagnosis not present

## 2023-10-23 DIAGNOSIS — M25572 Pain in left ankle and joints of left foot: Secondary | ICD-10-CM

## 2023-10-23 NOTE — Therapy (Signed)
OUTPATIENT PHYSICAL THERAPY LOWER EXTREMITY TREATMENT/ PROGRESS NOTE   Patient Name: Kayla Henry MRN: [id number] DOB:09/15/1956, 67 y.o., female Today's Date: 10/23/2023  Physical Therapy Progress Note  Dates of Reporting Period: 09/04/23 to 10/23/23    END OF SESSION:  PT End of Session - 10/23/23 1608     Visit Number 6    Number of Visits 16    Date for PT Re-Evaluation 11/27/23    Authorization Type humana - auth approved 12 visits approved from 09/04/2023 - 11/19/2023    Authorization - Number of Visits 12    PT Start Time 1607    PT Stop Time 1645    PT Time Calculation (min) 38 min    Activity Tolerance Patient tolerated treatment well    Behavior During Therapy WFL for tasks assessed/performed               Past Medical History:  Diagnosis Date   Allergy    Asthma    Chronic sinusitis    Diverticulosis    RLS (restless legs syndrome)    Past Surgical History:  Procedure Laterality Date   COLONOSCOPY     Dr. 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 05/06/2023   Insomnia 05/06/2023   Asthma 05/06/2023   Chronic idiopathic urticaria 06/27/2016   Acute respiratory failure (HCC)    Angioedema 07/21/2015   Arthritis 09/14/2014   Other allergic rhinitis 09/14/2014   Intrinsic asthma 08/31/2013   Left leg pain 12/15/2012   RLS (restless legs syndrome) 09/11/2011    PCP: Veryl Speak, MD  REFERRING PROVIDER: Louann Sjogren, DPM  REFERRING DIAG:  M20.12 (ICD-10-CM) - Hallux valgus (acquired), left foot Z98.890 (ICD-10-CM) - Status post surgery  THERAPY DIAG:  Pain in left ankle and joints of left foot  Difficulty in walking, not elsewhere classified  Muscle weakness (generalized)  Rationale for Evaluation and Treatment: Rehabilitation  ONSET DATE: DOS - 05/21/23 --- BUNIONECTOMY WITH OSTEOTOMY/ FUSION AT LEFT 1ST METATARSAL- MEDICAL CONEIFORM  JOINT, POSSIBLE WEDGE OSTEOTOMY OF LEFT 1ST PROXIMAL PHALANX INTERNAL FIXATION TO BE USED  SUBJECTIVE:   SUBJECTIVE STATEMENT: 10/23/2023 Pt states she feels she is 50% better since beginning PT but notices when she's on her feet a lot she has throbbing in her foot. Has noticed improvement in less foot sensitivity.     EVAL: Patient reported she had pain in her toe for a while which resulted in her opting to have a bunion correction surgery.  Since the surgery she has had increased pain with movement and swelling in her foot.  She also has hypersensitivity along the top of her foot where the scar is.  Reports she was nonweightbearing for a couple weeks then in a cam boot for a few more weeks and now is weightbearing as tolerated in a regular shoe.  Reports she swims with no limitations.  She likes to walk but can only walk up to half a mile due to pain and previously she was walking up to 2 miles at a time.  She does not have pain at rest it is just with movement and especially with weightbearing and movement.  Reports she has to take stairs 1 at a time going down them due to pain.  PERTINENT HISTORY: asthma, angio edema PAIN:  Are you having pain? Yes: NPRS scale: 0/10 Pain location: left great toe and outside of  L shin Pain description: achy, throbbing  Aggravating factors: pushing off toe to walk during gait Relieving factors: rest  PRECAUTIONS: None  RED FLAGS: None   WEIGHT BEARING RESTRICTIONS: No  FALLS:  Has patient fallen in last 6 months? No  LIVING ENVIRONMENT: Lives with: lives with their spouse Lives in: House/apartment Stairs: Yes: Internal: 12 steps; on left going up and External: 2 steps; on left going up Has following equipment at home: None  OCCUPATION:  retried  swims - no issues, limited with walking - was walking 2 miles a day and now can only walk 1/2 mile  PLOF: Independent  PATIENT GOALS: To have less pain and be able to walk without  pain    OBJECTIVE:  Note: Objective measures were completed at Evaluation unless otherwise noted.  DIAGNOSTIC FINDINGS: regular imaging updates following post-op recovery  PATIENT SURVEYS:  FOTO 46%  (Eval) 63% (10/23/23) FOTO Predicted- 65%  COGNITION: Overall cognitive status: Within functional limits for tasks assessed     SENSATION: Not tested  EDEMA:  Circumferential: 21 cm L 20 R at ankle and Figure 8: L 49.0 and R 47.5cm  10/23/23 circumferential & Figure 8 At ankle: L 22.5 cm  R: 20.5 cm  Figure 8: L 49.6 cm & R 48.3 cm    PALPATION: Hypersensitivity over scar from surgery on left dorsal foot    LE Measurements Lower Extremity Right EVAL Left EVAL Right  (10/23/23) MMTs Left (10/23/23) MMTs   A/PROM MMT A/PROM MMT    Hip Flexion        Hip Extension        Hip Abduction        Hip Adduction        Hip Internal rotation        Hip External rotation        Knee Flexion        Knee Extension        Ankle Dorsiflexion  4+  4- 5 5  Ankle Plantarflexion        Ankle Inversion  4+  4- 4- 4  Ankle Eversion  4+  4- 4- 4-  Great Toe extension  4+  3+ 4+ 4  Great Toe Flexion        Great toe adduction        Great Toe abduction        Toe extension     5 5   (Blank rows = not tested) * pain     GAIT: Distance walked: 25 ft in clinic in regular tennis shoe Assistive device utilized: None Level of assistance: Modified independence Comments: antalgic gait, favors non surgical leg, limited toe off- flat foot gait   TODAY'S TREATMENT:                                                                                                                              DATE:   10/23/2023  Therapeutic  Exercise: Reviewed entire HEP : Supine: Seated: Great toe abduction bil x5 mins, percussion gun to left foot/ant tib and calf - 5 minutes Standing: Double leg heel raises 2x10 Stretches:  Updated objective measurements Neuromuscular Re-education: Manual Therapy:   Therapeutic Activity: Self Care:      Previous Therapeutic Exercise:  Aerobic: Supine: Prone:  Seated:  ant tib stretch L x5 15" holds, toe ext great toe x15 5" holds B, DF/PF x15 5" holds B, self mobilization with tiger tail 2 minutes     Standing:   Neuromuscular Re-education: tandem on blue foam x3 30" holds B, tandem on floor with head turns 3x5 B slow and controlled, narrow BOS on foam EC x5 15" holds, Narrow BOS on foam EO body turns 3x5 B Manual Therapy:  percussion gun to left foot/ant tib and calf - tolerated well. Therapeutic Activity: Self Care: Trigger Point Dry Needling:  Modalities:        PATIENT EDUCATION:  Education details: on  HEP Person educated: Patient Education method: Explanation, Demonstration, and Handouts Education comprehension: verbalized understanding   HOME EXERCISE PROGRAM: [phone]  ASSESSMENT:  CLINICAL IMPRESSION: 10/23/2023 Session focused on ther ex of intrinsic foot muscles and manual therapy to promote pain reduction & foot strength. Pt showed global foot and ankle strength improvements and a reduction in great toe swelling. Pt had difficulty with calf raises secondary to muscular fatigue and great toe abduction intervention secondary to appropriate muscular activation and utilized great toe extension in order to achieve abduction. Pt is progressing well and has met all STGs and has met or partially met multiple LTGs. Pt would benefit from further skilled PT to improve great toe ROM, gait, and address functional goals.      Eval: Patient presents to physical therapy status post left hallux valgus correction surgery.  Patient is over 3 months postop and continues to have pain and swelling in her foot that limits her ability to walk and go up and down the stairs in her home.  Patient demonstrates swelling, range of motion deficits, and strength deficits that are likely contributing to current presentation.  Session focused on  education and initiation of home exercise program.  Patient would greatly benefit from skilled PT to improve gait mechanics and return patient to optimal function.  OBJECTIVE IMPAIRMENTS: Abnormal gait, decreased activity tolerance, decreased balance, decreased knowledge of use of DME, decreased mobility, difficulty walking, decreased ROM, decreased strength, increased edema, improper body mechanics, postural dysfunction, and pain.   ACTIVITY LIMITATIONS: standing, squatting, stairs, transfers, and locomotion level  PARTICIPATION LIMITATIONS: cleaning, shopping, and community activity  PERSONAL FACTORS: Age and Fitness are also affecting patient's functional outcome.   REHAB POTENTIAL: Good  CLINICAL DECISION MAKING: Stable/uncomplicated  EVALUATION COMPLEXITY: Low   GOALS: Goals reviewed with patient? yes  SHORT TERM GOALS: Target date: 10/16/2023 Patient will be independent in self management strategies to improve quality of life and functional outcomes. Baseline: New Program Goal status: INITIAL  2.  Patient will report at least 50% improvement in overall symptoms and/or function to demonstrate improved functional mobility Baseline: 0% better Goal status: MET  3.  Patient will be able to acsend and descend the stairs with reciprocal gait pattern and use of railing as needed to improve ability to go up and down stairs in home. Baseline: Step to gait pattern Goal status: MET    LONG TERM GOALS: Target date: 11/27/2023  Patient will report at least 75% improvement in overall symptoms and/or function to demonstrate  improved functional mobility Baseline: 0% better Goal status: PROGRESSING  2.  Patient will improve score on FOTO outcomes measure to projected score to demonstrate overall improved function and QOL Baseline: see above Goal status: PROGRESSING  3.  Patient will be able to walk 2 miles without pain to return to prior level of function. Baseline: Currently walking  about half a mile Goal status: MET  4.  Patient will demonstrate less than a half a centimeter of swelling difference between left and right foot to demonstrate improved swelling in feet. Baseline: See above Goal status: PARTIALLY MET    PLAN:  PT FREQUENCY: 1-2x/week for total of 16 visits over 12 weeks certification period  PT DURATION: 12 weeks  PLANNED INTERVENTIONS: 97110-Therapeutic exercises, 97530- Therapeutic activity, 97112- Neuromuscular re-education, 97535- Self Care, 09811- Manual therapy, [phone]- Gait training, [phone]- Orthotic Fit/training, [phone]- Canalith repositioning, [id number]- Aquatic Therapy, 97014- Electrical stimulation (unattended), [phone]- Ionotophoresis 4mg /ml Dexamethasone, Patient/Family education, Balance training, Stair training, Taping, Dry Needling, Joint mobilization, Joint manipulation, Spinal manipulation, Spinal mobilization, Cryotherapy, and Moist heat  PLAN FOR NEXT SESSION: progress to SL heel raises as able and functional tasks   [employer], SPT   This entire session was performed under direct supervision and direction of a licensed Estate agent . I have personally read, edited and approve of the note as written.  4:48 PM, 10/23/23 Tereasa Coop, DPT Physical Therapy with [facility]

## 2023-10-24 ENCOUNTER — Encounter: Payer: Medicare PPO | Admitting: Physical Therapy

## 2023-10-29 DIAGNOSIS — L509 Urticaria, unspecified: Secondary | ICD-10-CM | POA: Diagnosis not present

## 2023-10-29 DIAGNOSIS — L501 Idiopathic urticaria: Secondary | ICD-10-CM | POA: Diagnosis not present

## 2023-10-31 ENCOUNTER — Encounter: Payer: Self-pay | Admitting: Physical Therapy

## 2023-10-31 ENCOUNTER — Ambulatory Visit: Payer: Medicare PPO | Admitting: Physical Therapy

## 2023-10-31 DIAGNOSIS — R262 Difficulty in walking, not elsewhere classified: Secondary | ICD-10-CM

## 2023-10-31 DIAGNOSIS — M25572 Pain in left ankle and joints of left foot: Secondary | ICD-10-CM | POA: Diagnosis not present

## 2023-10-31 DIAGNOSIS — M6281 Muscle weakness (generalized): Secondary | ICD-10-CM | POA: Diagnosis not present

## 2023-10-31 NOTE — Therapy (Signed)
OUTPATIENT PHYSICAL THERAPY LOWER EXTREMITY TREATMENT PHYSICAL THERAPY DISCHARGE SUMMARY  Visits from Start of Care: 7  Current functional level related to goals / functional outcomes: See below   Remaining deficits: See below   Education / Equipment: See below   Patient agrees to discharge. Patient goals were partially met. Patient is being discharged due to being pleased with the current functional level.   Patient Name: Kayla Henry MRN: [id number] DOB:1955-12-30, 67 y.o., female Today's Date: 10/31/2023    END OF SESSION:  PT End of Session - 10/31/23 1606     Visit Number 7    Number of Visits 16    Date for PT Re-Evaluation 11/27/23    Authorization Type human[employer] - auth approved 12 visits approved from 09/04/2023 - 11/19/2023    Authorization - Visit Number 7    Authorization - Number of Visits 12    PT Start Time 1607    PT Stop Time 1645    PT Time Calculation (min) 38 min    Activity Tolerance Patient tolerated treatment well    Behavior During Therapy WFL for tasks assessed/performed               Past Medical History:  Diagnosis Date   Allergy    Asthma    Chronic sinusitis    Diverticulosis    RLS (restless legs syndrome)    Past Surgical History:  Procedure Laterality Date   COLONOSCOPY     Dr. 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 05/06/2023   Insomnia 05/06/2023   Asthma 05/06/2023   Chronic idiopathic urticaria 06/27/2016   Acute respiratory failure (HCC)    Angioedema 07/21/2015   Arthritis 09/14/2014   Other allergic rhinitis 09/14/2014   Intrinsic asthma 08/31/2013   Left leg pain 12/15/2012   RLS (restless legs syndrome) 09/11/2011    PCP: Veryl Speak, MD  REFERRING PROVIDER: Louann Sjogren, DPM  REFERRING DIAG:  M20.12 (ICD-10-CM) - Hallux valgus (acquired), left foot Z98.890 (ICD-10-CM) - Status post  surgery  THERAPY DIAG:  Pain in left ankle and joints of left foot  Difficulty in walking, not elsewhere classified  Muscle weakness (generalized)  Rationale for Evaluation and Treatment: Rehabilitation  ONSET DATE: DOS - 05/21/23 --- BUNIONECTOMY WITH OSTEOTOMY/ FUSION AT LEFT 1ST METATARSAL- MEDICAL CONEIFORM JOINT, POSSIBLE WEDGE OSTEOTOMY OF LEFT 1ST PROXIMAL PHALANX INTERNAL FIXATION TO BE USED  SUBJECTIVE:   SUBJECTIVE STATEMENT: 10/31/2023 States overall she feels like she has leveled off. Reports she still has the swelling and the throbbing still happens a lot when she is on her feet a lot.     EVAL: Patient reported she had pain in her toe for a while which resulted in her opting to have a bunion correction surgery.  Since the surgery she has had increased pain with movement and swelling in her foot.  She also has hypersensitivity along the top of her foot where the scar is.  Reports she was nonweightbearing for a couple weeks then in a cam boot for a few more weeks and now is weightbearing as tolerated in a regular shoe.  Reports she swims with no limitations.  She likes to walk but can only walk up to half a mile due to pain and previously she was walking up to 2 miles at a time.  She does not have pain at rest it is  just with movement and especially with weightbearing and movement.  Reports she has to take stairs 1 at a time going down them due to pain.  PERTINENT HISTORY: asthma, angio edema PAIN:  Are you having pain? Yes: NPRS scale: 0/10 Pain location: left great toe and outside of L shin Pain description: achy, throbbing  Aggravating factors: pushing off toe to walk during gait Relieving factors: rest  PRECAUTIONS: None  RED FLAGS: None   WEIGHT BEARING RESTRICTIONS: No  FALLS:  Has patient fallen in last 6 months? No  LIVING ENVIRONMENT: Lives with: lives with their spouse Lives in: House/apartment Stairs: Yes: Internal: 12 steps; on left going up and  External: 2 steps; on left going up Has following equipment at home: None  OCCUPATION:  retried  swims - no issues, limited with walking - was walking 2 miles a day and now can only walk 1/2 mile  PLOF: Independent  PATIENT GOALS: To have less pain and be able to walk without pain    OBJECTIVE:  Note: Objective measures were completed at Evaluation unless otherwise noted.  DIAGNOSTIC FINDINGS: regular imaging updates following post-op recovery  PATIENT SURVEYS:  FOTO 46%  (Eval) 63% (10/23/23) FOTO Predicted- 65%  COGNITION: Overall cognitive status: Within functional limits for tasks assessed     SENSATION: Not tested  EDEMA:  Circumferential: 21 cm L 20 R at ankle and Figure 8: L 49.0 and R 47.5cm  10/23/23 circumferential & Figure 8 At ankle: L 22.5 cm  R: 20.5 cm  Figure 8: L 49.6 cm & R 48.3 cm    PALPATION: Hypersensitivity over scar from surgery on left dorsal foot    LE Measurements Lower Extremity Right EVAL Left EVAL Right  (10/23/23) MMTs Left (10/23/23) MMTs   A/PROM MMT A/PROM MMT    Hip Flexion        Hip Extension        Hip Abduction        Hip Adduction        Hip Internal rotation        Hip External rotation        Knee Flexion        Knee Extension        Ankle Dorsiflexion  4+  4- 5 5  Ankle Plantarflexion        Ankle Inversion  4+  4- 4- 4  Ankle Eversion  4+  4- 4- 4-  Great Toe extension  4+  3+ 4+ 4  Great Toe Flexion        Great toe adduction        Great Toe abduction        Toe extension     5 5   (Blank rows = not tested) * pain     GAIT: Distance walked: 25 ft in clinic in regular tennis shoe Assistive device utilized: None Level of assistance: Modified independence Comments: antalgic gait, favors non surgical leg, limited toe off- flat foot gait   TODAY'S TREATMENT:  DATE:    10/31/2023  Therapeutic Exercise: Reviewed entire HEP : Supine: Seated:  KT tape application and education, self use of percussion gun to left foot in between exercises 6 minutes Standing: Double leg heel raises 2x10-15, DF at wall - tight and tender- reduced after use of percussion gun, elevated squats and flat squats 10 minutes total, sumo squats x10  Stretches:    Neuromuscular Re-education: Manual Therapy:  Therapeutic Activity: Self Care: on typical healing process, continued swelling, anticipated long term progress and strengthening.          PATIENT EDUCATION:  Education details: on  HEP, on how to use slight elevation with squats/wear slight heel (like a boot) <1 inch) to help with stress on joint and reduce over time Person educated: Patient Education method: Explanation, Demonstration, and Handouts Education comprehension: verbalized understanding   HOME EXERCISE PROGRAM: [phone]  ASSESSMENT:  CLINICAL IMPRESSION: 10/31/2023 Focused on answering all questions and addressing concerns about continued throbbing and swelling. Patient to f/u with MD, discussed how long term healing takes > 6 month. Tolerated percussion gun well. Discussed trying KT tape to see if that helps promote lymphatic drainage. Overall patient doing well, reviewed entire HEP and patient to DC from PT to HEP secondary to independence in HEP and progress made.      Eval: Patient presents to physical therapy status post left hallux valgus correction surgery.  Patient is over 3 months postop and continues to have pain and swelling in her foot that limits her ability to walk and go up and down the stairs in her home.  Patient demonstrates swelling, range of motion deficits, and strength deficits that are likely contributing to current presentation.  Session focused on education and initiation of home exercise program.  Patient would greatly benefit from skilled PT to improve gait mechanics and return  patient to optimal function.  OBJECTIVE IMPAIRMENTS: Abnormal gait, decreased activity tolerance, decreased balance, decreased knowledge of use of DME, decreased mobility, difficulty walking, decreased ROM, decreased strength, increased edema, improper body mechanics, postural dysfunction, and pain.   ACTIVITY LIMITATIONS: standing, squatting, stairs, transfers, and locomotion level  PARTICIPATION LIMITATIONS: cleaning, shopping, and community activity  PERSONAL FACTORS: Age and Fitness are also affecting patient's functional outcome.   REHAB POTENTIAL: Good  CLINICAL DECISION MAKING: Stable/uncomplicated  EVALUATION COMPLEXITY: Low   GOALS: Goals reviewed with patient? yes  SHORT TERM GOALS: Target date: 10/16/2023 Patient will be independent in self management strategies to improve quality of life and functional outcomes. Baseline: New Program Goal status: INITIAL  2.  Patient will report at least 50% improvement in overall symptoms and/or function to demonstrate improved functional mobility Baseline: 0% better Goal status: MET  3.  Patient will be able to acsend and descend the stairs with reciprocal gait pattern and use of railing as needed to improve ability to go up and down stairs in home. Baseline: Step to gait pattern Goal status: MET    LONG TERM GOALS: Target date: 11/27/2023  Patient will report at least 75% improvement in overall symptoms and/or function to demonstrate improved functional mobility Baseline: 0% better Goal status: PROGRESSING  2.  Patient will improve score on FOTO outcomes measure to projected score to demonstrate overall improved function and QOL Baseline: see above Goal status: PROGRESSING  3.  Patient will be able to walk 2 miles without pain to return to prior level of function. Baseline: Currently walking about half a mile Goal status: MET  4.  Patient will demonstrate  less than a half a centimeter of swelling difference between left  and right foot to demonstrate improved swelling in feet. Baseline: See above Goal status: PARTIALLY MET    PLAN:  PT FREQUENCY: 1-2x/week for total of 16 visits over 12 weeks certification period  PT DURATION: 12 weeks  PLANNED INTERVENTIONS: 97110-Therapeutic exercises, 97530- Therapeutic activity, 97112- Neuromuscular re-education, 97535- Self Care, 16109- Manual therapy, [phone]- Gait training, [phone]- Orthotic Fit/training, [phone]- Canalith repositioning, [id number]- Aquatic Therapy, 97014- Electrical stimulation (unattended), [phone]- Ionotophoresis 4mg /ml Dexamethasone, Patient/Family education, Balance training, Stair training, Taping, Dry Needling, Joint mobilization, Joint manipulation, Spinal manipulation, Spinal mobilization, Cryotherapy, and Moist heat  PLAN FOR NEXT SESSION: DC to HEP   4:07 PM, 10/31/23 Tereasa Coop, DPT Physical Therapy with [facility]

## 2023-11-19 ENCOUNTER — Encounter: Payer: Self-pay | Admitting: Podiatry

## 2023-11-19 ENCOUNTER — Ambulatory Visit (INDEPENDENT_AMBULATORY_CARE_PROVIDER_SITE_OTHER): Payer: Medicare PPO

## 2023-11-19 ENCOUNTER — Ambulatory Visit: Payer: Medicare PPO | Admitting: Podiatry

## 2023-11-19 DIAGNOSIS — M778 Other enthesopathies, not elsewhere classified: Secondary | ICD-10-CM

## 2023-11-19 DIAGNOSIS — Z9889 Other specified postprocedural states: Secondary | ICD-10-CM | POA: Diagnosis not present

## 2023-11-19 MED ORDER — DEXAMETHASONE SODIUM PHOSPHATE 120 MG/30ML IJ SOLN
4.0000 mg | Freq: Once | INTRAMUSCULAR | Status: AC
  Administered 2023-11-19: 4 mg via INTRA_ARTICULAR

## 2023-11-19 MED ORDER — TRIAMCINOLONE ACETONIDE 10 MG/ML IJ SUSP
2.5000 mg | Freq: Once | INTRAMUSCULAR | Status: AC
  Administered 2023-11-19: 2.5 mg via INTRA_ARTICULAR

## 2023-11-19 NOTE — Progress Notes (Signed)
 Subjective:  Patient ID: Kayla Henry, female    DOB: [birth year],  MRN: [id number]  Chief Complaint  Patient presents with   Routine Post Op    Patient states she had surgery in July and she is still having some pain and swelling , no medication for pain     DOS: 05/21/23  Procedure: Left lapidus bunionectomy   67 y.o. female returns for  follow-up after lapidus procedure. Has been in PT and still getting soreness in her foot wondering if something else is going on or if this is still from surgery.   Review of Systems: Negative except as noted in the HPI. Denies N/V/F/Ch.  Past Medical History:  Diagnosis Date   Allergy     Asthma    Chronic sinusitis    Diverticulosis    RLS (restless legs syndrome)     Current Outpatient Medications:    amoxicillin -clavulanate (AUGMENTIN ) 875-125 MG tablet, Take 1 tablet by mouth 2 (two) times daily., Disp: 20 tablet, Rfl: 0   budesonide -formoterol  (SYMBICORT ) 160-4.5 MCG/ACT inhaler, Inhale 2 puffs into the lungs 2 (two) times daily., Disp: , Rfl:    fluticasone  (FLONASE ) 50 MCG/ACT nasal spray, Place 1 spray into both nostrils daily., Disp: , Rfl:    fluticasone  (FLONASE ) 50 MCG/ACT nasal spray, USE TWO SPRAY(S) IN EACH NOSTRIL EVERY DAY, Disp: 48 g, Rfl: 3   meloxicam  (MOBIC ) 15 MG tablet, Take 15 mg by mouth daily., Disp: , Rfl:    montelukast  (SINGULAIR ) 10 MG tablet, Place 1 tablet (10 mg total) into feeding tube at bedtime., Disp: , Rfl:    montelukast  (SINGULAIR ) 10 MG tablet, TAKE ONE TABLET BY MOUTH AT BEDTIME, Disp: 30 tablet, Rfl: 0   ondansetron  (ZOFRAN ) 4 MG tablet, Take 1 tablet (4 mg total) by mouth every 8 (eight) hours as needed for nausea or vomiting., Disp: 20 tablet, Rfl: 0   rOPINIRole  (REQUIP ) 1 MG tablet, TAKE ONE TABLET BY MOUTH THREE TIMES DAILY, Disp: 30 tablet, Rfl: 5   SYMBICORT  160-4.5 MCG/ACT inhaler, INHALE TWO PUFFS INTO THE LUNGS TWICE DAILY., Disp: 1 Inhaler, Rfl: 11  Social History   Tobacco Use  Smoking  Status Never  Smokeless Tobacco Never    Allergies  Allergen Reactions   Cephalosporins     hives   Sulfa Antibiotics Hives   Objective:  There were no vitals filed for this visit. There is no height or weight on file to calculate BMI. Constitutional Well developed. Well nourished.  Vascular Foot warm and well perfused. Capillary refill normal to all digits.   Neurologic Normal speech. Oriented to person, place, and time. Epicritic sensation to light touch grossly present bilaterally.  Dermatologic Skin healing well without signs of infection. Skin edges well coapted without signs of infection. Tender over the dorsum of the foot over the surgical site as well as some pain over the second TMTJ. Some pain over first MPJ as well. No pain with ROM of the joint.   Orthopedic: Tenderness to palpation noted about the surgical site.   Radiographs: Hardware intact and improved alignment of toe.  Appears to be consolidation at surgical site. There is more spurring and degenerative changes noted at second tarsometatarsal joint and some spurring at first MPJ.  Assessment:   1. Capsulitis of foot   2. Status post surgery      Plan:  Patient was evaluated and treated and all questions answered. Discussed post-operative soreness vs possible non-union of the joint vs new midfoot arthritis that is  now causing more pain.  Injection offered for second TMTJ Procedure below.  -Discussed swelling and using compression to help.  -WB Status: Continue WBAT in regular shoes.  CT ordered to further evaluate surgical site and midfoot arthritis as possible causes for pain.   Return after Ct.   No follow-ups on file.

## 2023-12-03 ENCOUNTER — Ambulatory Visit
Admission: RE | Admit: 2023-12-03 | Discharge: 2023-12-03 | Disposition: A | Discharge status: Home / Self Care | Payer: Medicare PPO | Source: Ambulatory Visit | Attending: Podiatry | Admitting: Podiatry

## 2023-12-03 DIAGNOSIS — M79672 Pain in left foot: Secondary | ICD-10-CM | POA: Diagnosis not present

## 2023-12-03 DIAGNOSIS — M778 Other enthesopathies, not elsewhere classified: Secondary | ICD-10-CM

## 2023-12-03 DIAGNOSIS — M19072 Primary osteoarthritis, left ankle and foot: Secondary | ICD-10-CM | POA: Diagnosis not present

## 2023-12-03 DIAGNOSIS — Z9889 Other specified postprocedural states: Secondary | ICD-10-CM

## 2023-12-17 DIAGNOSIS — L501 Idiopathic urticaria: Secondary | ICD-10-CM | POA: Diagnosis not present

## 2023-12-17 DIAGNOSIS — L509 Urticaria, unspecified: Secondary | ICD-10-CM | POA: Diagnosis not present

## 2024-01-28 DIAGNOSIS — L501 Idiopathic urticaria: Secondary | ICD-10-CM | POA: Diagnosis not present

## 2024-01-28 DIAGNOSIS — L509 Urticaria, unspecified: Secondary | ICD-10-CM | POA: Diagnosis not present

## 2024-03-10 ENCOUNTER — Other Ambulatory Visit: Payer: Self-pay | Admitting: Internal Medicine

## 2024-03-10 DIAGNOSIS — Z1211 Encounter for screening for malignant neoplasm of colon: Secondary | ICD-10-CM | POA: Diagnosis not present

## 2024-03-10 DIAGNOSIS — Z1331 Encounter for screening for depression: Secondary | ICD-10-CM | POA: Diagnosis not present

## 2024-03-10 DIAGNOSIS — L501 Idiopathic urticaria: Secondary | ICD-10-CM | POA: Diagnosis not present

## 2024-03-10 DIAGNOSIS — E2839 Other primary ovarian failure: Secondary | ICD-10-CM

## 2024-03-10 DIAGNOSIS — Z1239 Encounter for other screening for malignant neoplasm of breast: Secondary | ICD-10-CM | POA: Diagnosis not present

## 2024-03-10 DIAGNOSIS — N951 Menopausal and female climacteric states: Secondary | ICD-10-CM | POA: Diagnosis not present

## 2024-03-10 DIAGNOSIS — Z1231 Encounter for screening mammogram for malignant neoplasm of breast: Secondary | ICD-10-CM

## 2024-03-10 DIAGNOSIS — Z0000 Encounter for general adult medical examination without abnormal findings: Secondary | ICD-10-CM | POA: Diagnosis not present

## 2024-03-10 DIAGNOSIS — F5101 Primary insomnia: Secondary | ICD-10-CM | POA: Diagnosis not present

## 2024-03-10 DIAGNOSIS — J4541 Moderate persistent asthma with (acute) exacerbation: Secondary | ICD-10-CM | POA: Diagnosis not present

## 2024-03-10 DIAGNOSIS — I10 Essential (primary) hypertension: Secondary | ICD-10-CM | POA: Diagnosis not present

## 2024-03-10 DIAGNOSIS — G4733 Obstructive sleep apnea (adult) (pediatric): Secondary | ICD-10-CM | POA: Diagnosis not present

## 2024-03-10 DIAGNOSIS — L509 Urticaria, unspecified: Secondary | ICD-10-CM | POA: Diagnosis not present

## 2024-03-10 DIAGNOSIS — E785 Hyperlipidemia, unspecified: Secondary | ICD-10-CM | POA: Diagnosis not present

## 2024-03-16 DIAGNOSIS — R232 Flushing: Secondary | ICD-10-CM | POA: Diagnosis not present

## 2024-03-20 ENCOUNTER — Ambulatory Visit
Admission: RE | Admit: 2024-03-20 | Discharge: 2024-03-20 | Disposition: A | Discharge status: Home / Self Care | Source: Ambulatory Visit | Attending: Internal Medicine | Admitting: Internal Medicine

## 2024-03-20 DIAGNOSIS — Z1231 Encounter for screening mammogram for malignant neoplasm of breast: Secondary | ICD-10-CM | POA: Diagnosis not present

## 2024-03-23 DIAGNOSIS — F5104 Psychophysiologic insomnia: Secondary | ICD-10-CM | POA: Diagnosis not present

## 2024-03-23 DIAGNOSIS — G2581 Restless legs syndrome: Secondary | ICD-10-CM | POA: Diagnosis not present

## 2024-03-23 DIAGNOSIS — R0683 Snoring: Secondary | ICD-10-CM | POA: Diagnosis not present

## 2024-04-21 DIAGNOSIS — L509 Urticaria, unspecified: Secondary | ICD-10-CM | POA: Diagnosis not present

## 2024-04-21 DIAGNOSIS — L501 Idiopathic urticaria: Secondary | ICD-10-CM | POA: Diagnosis not present

## 2024-04-23 DIAGNOSIS — G2581 Restless legs syndrome: Secondary | ICD-10-CM | POA: Diagnosis not present

## 2024-04-23 DIAGNOSIS — R0683 Snoring: Secondary | ICD-10-CM | POA: Diagnosis not present

## 2024-05-11 DIAGNOSIS — I10 Essential (primary) hypertension: Secondary | ICD-10-CM | POA: Diagnosis not present

## 2024-05-11 DIAGNOSIS — Z79899 Other long term (current) drug therapy: Secondary | ICD-10-CM | POA: Diagnosis not present

## 2024-05-11 DIAGNOSIS — E785 Hyperlipidemia, unspecified: Secondary | ICD-10-CM | POA: Diagnosis not present

## 2024-06-02 DIAGNOSIS — L501 Idiopathic urticaria: Secondary | ICD-10-CM | POA: Diagnosis not present

## 2024-06-08 DIAGNOSIS — R059 Cough, unspecified: Secondary | ICD-10-CM | POA: Diagnosis not present

## 2024-06-15 DIAGNOSIS — Z5181 Encounter for therapeutic drug level monitoring: Secondary | ICD-10-CM | POA: Diagnosis not present

## 2024-06-15 DIAGNOSIS — R232 Flushing: Secondary | ICD-10-CM | POA: Diagnosis not present

## 2024-07-14 DIAGNOSIS — L501 Idiopathic urticaria: Secondary | ICD-10-CM | POA: Diagnosis not present

## 2024-08-25 DIAGNOSIS — L509 Urticaria, unspecified: Secondary | ICD-10-CM | POA: Diagnosis not present

## 2024-08-25 DIAGNOSIS — L501 Idiopathic urticaria: Secondary | ICD-10-CM | POA: Diagnosis not present

## 2024-08-31 DIAGNOSIS — Z79899 Other long term (current) drug therapy: Secondary | ICD-10-CM | POA: Diagnosis not present

## 2024-08-31 DIAGNOSIS — G4733 Obstructive sleep apnea (adult) (pediatric): Secondary | ICD-10-CM | POA: Diagnosis not present

## 2024-08-31 DIAGNOSIS — I10 Essential (primary) hypertension: Secondary | ICD-10-CM | POA: Diagnosis not present

## 2024-08-31 DIAGNOSIS — J4520 Mild intermittent asthma, uncomplicated: Secondary | ICD-10-CM | POA: Diagnosis not present

## 2024-09-11 DIAGNOSIS — Z5181 Encounter for therapeutic drug level monitoring: Secondary | ICD-10-CM | POA: Diagnosis not present

## 2024-09-11 DIAGNOSIS — R232 Flushing: Secondary | ICD-10-CM | POA: Diagnosis not present

## 2024-09-11 DIAGNOSIS — N951 Menopausal and female climacteric states: Secondary | ICD-10-CM | POA: Diagnosis not present

## 2024-10-06 DIAGNOSIS — L501 Idiopathic urticaria: Secondary | ICD-10-CM | POA: Diagnosis not present

## 2024-10-22 DIAGNOSIS — Z8709 Personal history of other diseases of the respiratory system: Secondary | ICD-10-CM | POA: Diagnosis not present

## 2024-10-22 DIAGNOSIS — R051 Acute cough: Secondary | ICD-10-CM | POA: Diagnosis not present

## 2024-10-22 DIAGNOSIS — J209 Acute bronchitis, unspecified: Secondary | ICD-10-CM | POA: Diagnosis not present

## 2024-10-22 DIAGNOSIS — J019 Acute sinusitis, unspecified: Secondary | ICD-10-CM | POA: Diagnosis not present

## 2024-11-25 ENCOUNTER — Other Ambulatory Visit

## 2024-11-25 ENCOUNTER — Ambulatory Visit (HOSPITAL_BASED_OUTPATIENT_CLINIC_OR_DEPARTMENT_OTHER)
Admission: RE | Admit: 2024-11-25 | Discharge: 2024-11-25 | Disposition: A | Discharge status: Home / Self Care | Source: Ambulatory Visit | Attending: Internal Medicine | Admitting: Internal Medicine

## 2024-11-25 DIAGNOSIS — E2839 Other primary ovarian failure: Secondary | ICD-10-CM | POA: Insufficient documentation
